# Patient Record
Sex: Male | Born: 1980
Health system: Southern US, Community
[De-identification: ages and names within clinical notes are randomized; demographics above are authoritative.]

## PROBLEM LIST (undated history)

## (undated) DIAGNOSIS — M549 Dorsalgia, unspecified: Secondary | ICD-10-CM

## (undated) DIAGNOSIS — R945 Abnormal results of liver function studies: Secondary | ICD-10-CM

## (undated) DIAGNOSIS — E785 Hyperlipidemia, unspecified: Secondary | ICD-10-CM

## (undated) DIAGNOSIS — E739 Lactose intolerance, unspecified: Secondary | ICD-10-CM

## (undated) DIAGNOSIS — R7989 Other specified abnormal findings of blood chemistry: Secondary | ICD-10-CM

## (undated) DIAGNOSIS — E669 Obesity, unspecified: Secondary | ICD-10-CM

## (undated) HISTORY — DX: Hyperlipidemia, unspecified: E78.5

## (undated) HISTORY — DX: Other specified abnormal findings of blood chemistry: R79.89

## (undated) HISTORY — DX: Lactose intolerance, unspecified: E73.9

## (undated) HISTORY — DX: Abnormal results of liver function studies: R94.5

## (undated) HISTORY — PX: NO PAST SURGERIES: SHX2092

## (undated) HISTORY — DX: Obesity, unspecified: E66.9

---

## 2006-12-24 ENCOUNTER — Ambulatory Visit: Payer: Self-pay | Admitting: Internal Medicine

## 2006-12-24 DIAGNOSIS — D239 Other benign neoplasm of skin, unspecified: Secondary | ICD-10-CM

## 2006-12-25 LAB — CONVERTED CEMR LAB
Basophils Relative: 0.6 % (ref 0.0–1.0)
CO2: 31 meq/L (ref 19–32)
Calcium: 9.4 mg/dL (ref 8.4–10.5)
Chloride: 105 meq/L (ref 96–112)
Cholesterol: 203 mg/dL (ref 0–200)
Creatinine, Ser: 1.2 mg/dL (ref 0.4–1.5)
Direct LDL: 141.6 mg/dL
Eosinophils Relative: 4.5 % (ref 0.0–5.0)
GFR calc non Af Amer: 77 mL/min
Glucose, Bld: 67 mg/dL — ABNORMAL LOW (ref 70–99)
HCT: 47.7 % (ref 39.0–52.0)
Neutrophils Relative %: 47.1 % (ref 43.0–77.0)
Platelets: 157 10*3/uL (ref 150–400)
RBC: 5.37 M/uL (ref 4.22–5.81)
RDW: 13.3 % (ref 11.5–14.6)
Sodium: 140 meq/L (ref 135–145)
TSH: 2.77 microintl units/mL (ref 0.35–5.50)
WBC: 7 10*3/uL (ref 4.5–10.5)

## 2008-01-21 ENCOUNTER — Ambulatory Visit: Payer: Self-pay | Admitting: Internal Medicine

## 2008-01-24 ENCOUNTER — Encounter (INDEPENDENT_AMBULATORY_CARE_PROVIDER_SITE_OTHER): Payer: Self-pay | Admitting: *Deleted

## 2008-01-27 ENCOUNTER — Encounter (INDEPENDENT_AMBULATORY_CARE_PROVIDER_SITE_OTHER): Payer: Self-pay | Admitting: *Deleted

## 2008-09-01 ENCOUNTER — Ambulatory Visit: Payer: Self-pay | Admitting: Internal Medicine

## 2008-09-11 ENCOUNTER — Encounter (INDEPENDENT_AMBULATORY_CARE_PROVIDER_SITE_OTHER): Payer: Self-pay | Admitting: *Deleted

## 2008-10-27 ENCOUNTER — Encounter: Payer: Self-pay | Admitting: Internal Medicine

## 2009-01-28 ENCOUNTER — Ambulatory Visit: Payer: Self-pay | Admitting: Internal Medicine

## 2009-01-28 ENCOUNTER — Encounter (INDEPENDENT_AMBULATORY_CARE_PROVIDER_SITE_OTHER): Payer: Self-pay | Admitting: *Deleted

## 2009-02-10 ENCOUNTER — Ambulatory Visit: Payer: Self-pay | Admitting: Internal Medicine

## 2009-02-16 LAB — CONVERTED CEMR LAB
ALT: 35 units/L (ref 0–53)
AST: 32 units/L (ref 0–37)
Basophils Absolute: 0 10*3/uL (ref 0.0–0.1)
Basophils Relative: 0.8 % (ref 0.0–3.0)
CO2: 30 meq/L (ref 19–32)
Calcium: 9.5 mg/dL (ref 8.4–10.5)
Creatinine, Ser: 1.1 mg/dL (ref 0.4–1.5)
Eosinophils Absolute: 0.2 10*3/uL (ref 0.0–0.7)
GFR calc non Af Amer: 83.62 mL/min (ref >60)
HDL: 42.5 mg/dL (ref >39.00)
Lymphocytes Relative: 38.8 % (ref 12.0–46.0)
MCHC: 33 g/dL (ref 30.0–36.0)
Monocytes Relative: 7.1 % (ref 3.0–12.0)
Neutrophils Relative %: 50.4 % (ref 43.0–77.0)
RBC: 5.21 M/uL (ref 4.22–5.81)
Triglycerides: 132 mg/dL (ref 0.0–149.0)

## 2010-02-17 ENCOUNTER — Ambulatory Visit: Payer: Self-pay | Admitting: Internal Medicine

## 2010-02-23 LAB — CONVERTED CEMR LAB
Basophils Absolute: 0 10*3/uL (ref 0.0–0.1)
Basophils Relative: 0.6 % (ref 0.0–3.0)
Calcium: 9.6 mg/dL (ref 8.4–10.5)
GFR calc non Af Amer: 71.66 mL/min (ref >60.00)
HDL: 42.9 mg/dL (ref >39.00)
Hemoglobin: 15.9 g/dL (ref 13.0–17.0)
Lymphocytes Relative: 42.5 % (ref 12.0–46.0)
Monocytes Relative: 7.7 % (ref 3.0–12.0)
Neutro Abs: 3.7 10*3/uL (ref 1.4–7.7)
RBC: 5.28 M/uL (ref 4.22–5.81)
Sodium: 140 meq/L (ref 135–145)
Total CHOL/HDL Ratio: 4
VLDL: 24.2 mg/dL (ref 0.0–40.0)
WBC: 8.1 10*3/uL (ref 4.5–10.5)

## 2010-03-31 NOTE — Assessment & Plan Note (Signed)
Summary: CPX,FASTING,[employer] INS/RH......   Vital Signs:  Patient profile:   30 year old male Height:      70 inches Weight:      221.25 pounds BMI:     31.86 Pulse rate:   92 / minute Pulse rhythm:   regular BP sitting:   116 / 74  (left arm) Cuff size:   large  Vitals Entered By: Army Fossa CMA (February 17, 2010 8:26 AM)  CC: CPX, fasting  Comments no complaints  [facility] [address]    History of Present Illness: CPX  Current Medications (verified): 1)  None  Allergies (verified): No Known Drug Allergies  Past History:  Past Medical History: Reviewed history from 12/24/2006 and no changes required. NONE  Past Surgical History: Reviewed history from 12/24/2006 and no changes required. NONE  Family History: MOTHER:LIVING FATHER:LIVING 1 BROTHER:LIVING CANCER: PGF colon ca--no prostate ca--no DM-- MGF MI--no  Social History: Single no children from [place] tobacco-- no ETOH-- socially Exercise-- does go to the gym, bike diet-- regular  Review of Systems CV:  Denies chest pain or discomfort and swelling of feet. Resp:  Denies cough and shortness of breath. GI:  Denies bloody stools, nausea, and vomiting. GU:  Denies dysuria and hematuria. Psych:  Denies anxiety and depression.  Physical Exam  General:  alert and well-developed.   Neck:  no thyromegaly   Lungs:  normal respiratory effort, no intercostal retractions, no accessory muscle use, and normal breath sounds.   Heart:  normal rate, regular rhythm, no murmur, and no gallop.   Abdomen:  soft, non-tender, no distention, no masses, no hepatomegaly, and no splenomegaly.   Extremities:  no pretibial edema bilaterally  Psych:  Oriented X3, memory intact for recent and remote, normally interactive, good eye contact, not anxious appearing, and not depressed appearing.     Impression & Recommendations:  Problem # 1:  HEALTH SCREENING (ICD-V70.0) Td 2008 we runned out of flu shots, rec to go to  his pharmacy if interested   diet- exercise--  encouraged continue w/ exercise, improve diet (printed material provided) does STE, encouraged to continue   Orders: Venipuncture (16109) TLB-Lipid Panel (80061-LIPID) TLB-BMP (Basic Metabolic Panel-BMET) (80048-METABOL) TLB-CBC Platelet - w/Differential (85025-CBCD) Specimen Handling (60454) Specimen Handling (09811)  Problem # 2:  MOLE (ICD-216.9) so far has not scheduled surgery  for his large mole @  left leg. I explained the risk of  malignancy Encouraged him to call Dr. Corliss Skains and schedule the excision  Patient Instructions: 1)  Please schedule a follow-up appointment in 1 year.    Orders Added: 1)  Venipuncture [36415] 2)  TLB-Lipid Panel [80061-LIPID] 3)  TLB-BMP (Basic Metabolic Panel-BMET) [80048-METABOL] 4)  TLB-CBC Platelet - w/Differential [85025-CBCD] 5)  Specimen Handling [99000] 6)  Est. Patient age 38-39 [34]     Risk Factors:  Alcohol use:  no

## 2010-05-31 ENCOUNTER — Telehealth: Payer: Self-pay | Admitting: Internal Medicine

## 2010-05-31 NOTE — Telephone Encounter (Signed)
Schedule a OV

## 2010-05-31 NOTE — Telephone Encounter (Signed)
Pt would like to be STD tested. Please provide orders so an appt can be made.

## 2010-05-31 NOTE — Telephone Encounter (Signed)
Pt called back and scheduled appt for April 13th @ 10:30 w/ Dr. Drue Novel.

## 2010-05-31 NOTE — Telephone Encounter (Signed)
Pt refused appt

## 2010-05-31 NOTE — Telephone Encounter (Signed)
lmtcb

## 2010-06-08 ENCOUNTER — Encounter: Payer: Self-pay | Admitting: Internal Medicine

## 2010-06-10 ENCOUNTER — Ambulatory Visit (INDEPENDENT_AMBULATORY_CARE_PROVIDER_SITE_OTHER): Payer: BC Managed Care – PPO | Admitting: Internal Medicine

## 2010-06-10 ENCOUNTER — Encounter: Payer: Self-pay | Admitting: Internal Medicine

## 2010-06-10 VITALS — BP 140/84 | HR 82 | Wt 223.2 lb

## 2010-06-10 DIAGNOSIS — N342 Other urethritis: Secondary | ICD-10-CM | POA: Insufficient documentation

## 2010-06-10 MED ORDER — CEFIXIME 400 MG PO TABS: 400.0000 mg | ORAL_TABLET | Freq: Every day | ORAL | Status: AC

## 2010-06-10 NOTE — Assessment & Plan Note (Addendum)
Recently diagnosed with Chlamydia urethritis. Patient was asymptomatic. My personal preference is to treat for gonorrhea once I diagnosed chlamydia Plan: Repeat HIV test in 6 months Antibiotics,  See prescription All his sexual partners need to be notified. Patient aware. Safe sex discussed as well

## 2010-06-10 NOTE — Progress Notes (Signed)
  Subjective:    Patient ID: Bradley James, male    DOB: September 24, 1980, 30 y.o.   MRN: [id number]  HPI  Micah Flesher to the health department a few days ago, was diagnosed with chlamydia. Gonorrhea test was negative. RPR was negative. HIV pending. He was treated with a single dose of an antibiotic (zithromax?)  No past medical history on file.  No past surgical history on file.  Review of Systems Patient went to the health department just for a checkup, he was asymptomatic. Last time he was sexually active without protection was  09-2009. Currently feeling well. No dysuria, hematuria, penile discharge or genital rash    Objective:   Physical Exam Alert oriented in no apparent distress       Assessment & Plan:  Face-to-face >15 minutes, more than 50% of this time counseling

## 2010-12-09 ENCOUNTER — Ambulatory Visit (INDEPENDENT_AMBULATORY_CARE_PROVIDER_SITE_OTHER): Payer: BC Managed Care – PPO | Admitting: Internal Medicine

## 2010-12-09 ENCOUNTER — Encounter: Payer: Self-pay | Admitting: Internal Medicine

## 2010-12-09 VITALS — BP 104/72 | HR 65 | Temp 98.3°F | Resp 16 | Wt 227.5 lb

## 2010-12-09 DIAGNOSIS — K2940 Chronic atrophic gastritis without bleeding: Secondary | ICD-10-CM

## 2010-12-09 DIAGNOSIS — N342 Other urethritis: Secondary | ICD-10-CM

## 2010-12-09 DIAGNOSIS — K2950 Unspecified chronic gastritis without bleeding: Secondary | ICD-10-CM | POA: Insufficient documentation

## 2010-12-09 NOTE — Patient Instructions (Signed)
Continue medicines as prescribed for 2 weeks

## 2010-12-09 NOTE — Assessment & Plan Note (Addendum)
Documented gastric  H. pylori infection by biopsy 09-2010 I agree with current regimen that includes PPIs, amoxicillin 1 g twice a day for 2 weeks, clarithromycin 500 mg twice a day for 2 weeks.  Encourage compliance.

## 2010-12-09 NOTE — Assessment & Plan Note (Signed)
Recheck HIV and RPR

## 2010-12-09 NOTE — Progress Notes (Signed)
  Subjective:    Patient ID: Bradley James, male    DOB: [birth year], 30 y.o.   MRN: [id number]  HPI Routine office visit History of urethritis, now asx  In August , he was visiting [place], developed a stomach problems mostly postprandial diarrhea, went to a gastroenterologist, had EGD 10/12/2010, it showed chronic gastritis, biopsies showed Helicobacter pylori. He was prescribed antibiotics which he actually started 5 days ago. See assessment and plan  No past medical history on file.  No past surgical history on file.    Review of Systems Since the endoscopy, he is doing better, suspected diarrhea was rather related to food in [place]. Currently he denies abdominal pain blood in the stools or any GI symptoms. Also denies dysuria, penile discharge.     Objective:   Physical Exam  Constitutional: He appears well-developed and well-nourished.  Abdominal: Soft. Bowel sounds are normal. He exhibits no distension. There is no tenderness. There is no rebound and no guarding.       Assessment & Plan:

## 2010-12-14 ENCOUNTER — Telehealth: Payer: Self-pay | Admitting: *Deleted

## 2010-12-14 NOTE — Telephone Encounter (Signed)
Left msg for pt to return call.

## 2010-12-14 NOTE — Telephone Encounter (Signed)
Message copied by Doristine Devoid on Wed Dec 14, 2010  4:28 PM ------      Message from: Bradley James      Created: Tue Dec 13, 2010  6:04 PM       Advise patient, labs are normal

## 2011-06-09 ENCOUNTER — Encounter: Payer: BC Managed Care – PPO | Admitting: Internal Medicine

## 2011-06-26 ENCOUNTER — Ambulatory Visit (INDEPENDENT_AMBULATORY_CARE_PROVIDER_SITE_OTHER): Payer: BC Managed Care – PPO | Admitting: Internal Medicine

## 2011-06-26 ENCOUNTER — Encounter: Payer: Self-pay | Admitting: Internal Medicine

## 2011-06-26 DIAGNOSIS — Z0000 Encounter for general adult medical examination without abnormal findings: Secondary | ICD-10-CM | POA: Insufficient documentation

## 2011-06-26 LAB — CBC WITH DIFFERENTIAL/PLATELET
Basophils Absolute: 0 10*3/uL (ref 0.0–0.1)
Eosinophils Absolute: 0.2 10*3/uL (ref 0.0–0.7)
Lymphocytes Relative: 40.6 % (ref 12.0–46.0)
MCHC: 33.2 g/dL (ref 30.0–36.0)
MCV: 89.1 fl (ref 78.0–100.0)
Monocytes Absolute: 0.4 10*3/uL (ref 0.1–1.0)
Neutrophils Relative %: 48.5 % (ref 43.0–77.0)
Platelets: 131 10*3/uL — ABNORMAL LOW (ref 150.0–400.0)
WBC: 5.9 10*3/uL (ref 4.5–10.5)

## 2011-06-26 LAB — LIPID PANEL
Cholesterol: 184 mg/dL (ref 0–200)
LDL Cholesterol: 98 mg/dL (ref 0–99)
Total CHOL/HDL Ratio: 4
Triglycerides: 190 mg/dL — ABNORMAL HIGH (ref 0.0–149.0)
VLDL: 38 mg/dL (ref 0.0–40.0)

## 2011-06-26 LAB — COMPREHENSIVE METABOLIC PANEL
AST: 35 U/L (ref 0–37)
Albumin: 4.2 g/dL (ref 3.5–5.2)
Alkaline Phosphatase: 49 U/L (ref 39–117)
Glucose, Bld: 84 mg/dL (ref 70–99)
Potassium: 4.1 mEq/L (ref 3.5–5.1)
Sodium: 141 mEq/L (ref 135–145)
Total Bilirubin: 0.5 mg/dL (ref 0.3–1.2)
Total Protein: 7.3 g/dL (ref 6.0–8.3)

## 2011-06-26 NOTE — Patient Instructions (Addendum)
lactaid OTC as needed if you like milk Exercise 30 minutes every day. Wide shoes with arch support

## 2011-06-26 NOTE — Assessment & Plan Note (Addendum)
Td 08 Conseled  about diet, exercise, BMI. labs Self testicular exam encouraged Return to the office in one year Other issues: apparently has lactose intolerance, recommend lactate. Feet pain, recommend wide shoes and arch support

## 2011-06-26 NOTE — Progress Notes (Signed)
  Subjective:    Patient ID: Bradley James, male    DOB: 12-Oct-1980, 31 y.o.   MRN: [id number]  HPI CPX  Past Medical History: no  Past Surgical History: no  Family History: M& F & Brpother:LIVING CANCER: PGF, uncle (?leukemia) colon ca--no prostate ca--no DM-- MGF MI--no  Social History: Married, no children from [place] tobacco-- no ETOH-- socially Exercise-- no lately diet-- regular  Review of Systems Complaining of R>L feet pain, mostly in the arch, he works on his feet a lot. No chest pain or short of breath No nausea or vomiting.occ diarrhea, usually related to dairy products. Has gained weight, admits to a sedentary lifestyle, diabetes regular No dysuria, gross artery or difficulty urinating    Objective:   Physical Exam General:  alert and well-developed.   Neck:  no thyromegaly   Lungs:  normal respiratory effort, no intercostal retractions, no accessory muscle use, and normal breath sounds.   Heart:  normal rate, regular rhythm, no murmur, and no gallop.   Abdomen:  soft, non-tender, no distention, no masses, no hepatomegaly, and no splenomegaly.   Extremities:  no pretibial edema bilaterally  Psych:  Oriented X3, memory intact for recent and remote, normally interactive, good eye contact, not anxious appearing, and not depressed appearing.      Assessment & Plan:

## 2011-06-29 ENCOUNTER — Encounter: Payer: Self-pay | Admitting: *Deleted

## 2012-04-13 ENCOUNTER — Other Ambulatory Visit: Payer: Self-pay

## 2012-06-25 ENCOUNTER — Encounter: Payer: BC Managed Care – PPO | Admitting: Internal Medicine

## 2012-07-30 ENCOUNTER — Ambulatory Visit (INDEPENDENT_AMBULATORY_CARE_PROVIDER_SITE_OTHER): Payer: BC Managed Care – PPO | Admitting: Internal Medicine

## 2012-07-30 ENCOUNTER — Encounter: Payer: Self-pay | Admitting: Internal Medicine

## 2012-07-30 VITALS — BP 112/82 | HR 64 | Temp 98.4°F | Ht 70.0 in | Wt 218.0 lb

## 2012-07-30 DIAGNOSIS — Z0000 Encounter for general adult medical examination without abnormal findings: Secondary | ICD-10-CM

## 2012-07-30 NOTE — Patient Instructions (Addendum)
Come back fasting: FLP, LFTs--- dx V70 ---- Next visit one year

## 2012-07-30 NOTE — Assessment & Plan Note (Addendum)
Td 08 Doing well w/ about diet, exercise, has lost some weight, praised Labs History lactose intolerance, well-controlled w/ avoidance Self testicular exam-- reports exams are normal Return to the office in one year

## 2012-07-30 NOTE — Progress Notes (Signed)
  Subjective:    Patient ID: Bradley James, male    DOB: 1980-06-10, 32 y.o.   MRN: [id number]  HPI CPX  No past medical history on file.  Past Surgical History  Procedure Laterality Date  . No past surgeries     History   Social History  . Marital Status: Single    Spouse Name: N/A    Number of Children: 0  . Years of Education: N/A   Occupational History  . machine operator     Social History Main Topics  . Smoking status: Never Smoker   . Smokeless tobacco: Never Used  . Alcohol Use: Yes     Comment: socially   . Drug Use: No  . Sexually Active: Not on file   Other Topics Concern  . Not on file   Social History Narrative   Original from [place], married                   Family History  Problem Relation Age of Onset  . Cancer Paternal Grandfather     stomach, let in life  . Prostate cancer Neg Hx   . Diabetes Maternal Grandfather   . Heart attack Neg Hx   . Hypertension Other     aunt  . Hyperlipidemia Father     high TG      Review of Systems Active at work, does exercise regularly , active doing yard work No chest pain or shortness or breath No nausea, vomiting, diarrhea or blood in the stools. Within their problems due to lactose intolerance. One time had blurred vision for a short period of time, did not have headaches, nausea, fainting. No dysuria or gross hematuria.     Objective:   Physical Exam BP 112/82  Pulse 64  Temp(Src) 98.4 F (36.9 C) (Oral)  Ht 5\' 10"  (1.778 m)  Wt 218 lb (98.884 kg)  BMI 31.28 kg/m2  SpO2 98%  General -- alert, well-developed, NAD   Neck --no thyromegaly , normal carotid pulse Lungs -- normal respiratory effort, no intercostal retractions, no accessory muscle use, and normal breath sounds.   Heart-- normal rate, regular rhythm, no murmur, and no gallop.   Abdomen--soft, non-tender, no distention, no masses, no HSM, no guarding, and no rigidity.   Extremities-- no pretibial edema bilaterally Neurologic--  alert & oriented X3 and strength normal in all extremities. Psych-- Cognition and judgment appear intact. Alert and cooperative with normal attention span and concentration.  not anxious appearing and not depressed appearing.        Assessment & Plan:

## 2012-07-31 ENCOUNTER — Other Ambulatory Visit: Payer: Self-pay | Admitting: Internal Medicine

## 2012-07-31 DIAGNOSIS — Z0000 Encounter for general adult medical examination without abnormal findings: Secondary | ICD-10-CM

## 2012-08-02 ENCOUNTER — Other Ambulatory Visit: Payer: BC Managed Care – PPO

## 2012-08-08 ENCOUNTER — Other Ambulatory Visit (INDEPENDENT_AMBULATORY_CARE_PROVIDER_SITE_OTHER): Payer: BC Managed Care – PPO

## 2012-08-08 DIAGNOSIS — Z0000 Encounter for general adult medical examination without abnormal findings: Secondary | ICD-10-CM

## 2012-08-08 LAB — LIPID PANEL
Cholesterol: 172 mg/dL (ref 0–200)
HDL: 47 mg/dL (ref >39.00)
Triglycerides: 99 mg/dL (ref 0.0–149.0)

## 2012-08-08 LAB — HEPATIC FUNCTION PANEL
ALT: 33 U/L (ref 0–53)
AST: 23 U/L (ref 0–37)
Albumin: 4 g/dL (ref 3.5–5.2)
Alkaline Phosphatase: 48 U/L (ref 39–117)
Total Protein: 6.9 g/dL (ref 6.0–8.3)

## 2012-12-03 ENCOUNTER — Encounter: Payer: Self-pay | Admitting: Family

## 2012-12-03 ENCOUNTER — Ambulatory Visit (INDEPENDENT_AMBULATORY_CARE_PROVIDER_SITE_OTHER): Payer: BC Managed Care – PPO | Admitting: Family

## 2012-12-03 ENCOUNTER — Telehealth: Payer: Self-pay | Admitting: *Deleted

## 2012-12-03 VITALS — BP 124/84 | HR 66 | Temp 98.4°F | Resp 16 | Ht 70.0 in | Wt 228.0 lb

## 2012-12-03 DIAGNOSIS — S335XXA Sprain of ligaments of lumbar spine, initial encounter: Secondary | ICD-10-CM

## 2012-12-03 DIAGNOSIS — S39012A Strain of muscle, fascia and tendon of lower back, initial encounter: Secondary | ICD-10-CM | POA: Insufficient documentation

## 2012-12-03 MED ORDER — CYCLOBENZAPRINE HCL 5 MG PO TABS: 5.0000 mg | ORAL_TABLET | Freq: Every evening | ORAL | Status: DC | PRN

## 2012-12-03 MED ORDER — METHYLPREDNISOLONE 4 MG PO KIT: PACK | ORAL | Status: DC

## 2012-12-03 NOTE — Progress Notes (Signed)
  Subjective:    Patient ID: Bradley James, male    DOB: [birth year], 32 y.o.   MRN: [id number]  HPI  Bradley James is a 32 yr old male who presents today with chief complaint of low back pain.  Reports that pain started 2 days ago after walking down some steps.  Has been present since that time. Denies associated radiation or LE weakness.  Reports similar symptoms x 1 day a year ago.  Pt reports that his current pain is 7-8/10.     Review of Systems See HPI  No past medical history on file.  History   Social History  . Marital Status: Single    Spouse Name: N/A    Number of Children: 0  . Years of Education: N/A   Occupational History  . machine operator     Social History Main Topics  . Smoking status: Never Smoker   . Smokeless tobacco: Never Used  . Alcohol Use: Yes     Comment: socially   . Drug Use: No  . Sexual Activity: Not on file   Other Topics Concern  . Not on file   Social History Narrative   Original from [place], married                    Past Surgical History  Procedure Laterality Date  . No past surgeries      Family History  Problem Relation Age of Onset  . Cancer Paternal Grandfather     stomach, let in life  . Prostate cancer Neg Hx   . Diabetes Maternal Grandfather   . Heart attack Neg Hx   . Hypertension Other     aunt  . Hyperlipidemia Father     high TG    No Known Allergies  No current outpatient prescriptions on file prior to visit.   No current facility-administered medications on file prior to visit.    BP 124/84  Pulse 66  Temp(Src) 98.4 F (36.9 C) (Oral)  Resp 16  Ht 5\' 10"  (1.778 m)  Wt 228 lb (103.42 kg)  BMI 32.71 kg/m2  SpO2 99%        Objective:   Physical Exam  Constitutional: He is oriented to person, place, and time. He appears well-developed and well-nourished. No distress.  Cardiovascular: Normal rate and regular rhythm.   No murmur heard. Pulmonary/Chest: Effort normal and breath sounds normal.  No respiratory distress. He has no wheezes. He has no rales. He exhibits no tenderness.  Musculoskeletal: He exhibits no edema.       Cervical back: He exhibits no tenderness.       Thoracic back: He exhibits no tenderness.       Lumbar back: He exhibits no tenderness.  Neurological: He is alert and oriented to person, place, and time.  Psychiatric: He has a normal mood and affect. His behavior is normal. Judgment and thought content normal.  mild increase in Low back pain with straight leg raise bilaterally.        Assessment & Plan:

## 2012-12-03 NOTE — Patient Instructions (Addendum)
Call if symptoms worsen or if not improved in 1 week.  Back Pain, Adult Low back pain is very common. About 1 in 5 people have back pain.The cause of low back pain is rarely dangerous. The pain often gets better over time.About half of people with a sudden onset of back pain feel better in just 2 weeks. About 8 in 10 people feel better by 6 weeks.  CAUSES Some common causes of back pain include:  Strain of the muscles or ligaments supporting the spine.  Wear and tear (degeneration) of the spinal discs.  Arthritis.  Direct injury to the back. DIAGNOSIS Most of the time, the direct cause of low back pain is not known.However, back pain can be treated effectively even when the exact cause of the pain is unknown.Answering your caregiver's questions about your overall health and symptoms is one of the most accurate ways to make sure the cause of your pain is not dangerous. If your caregiver needs more information, he or she may order lab work or imaging tests (X-rays or MRIs).However, even if imaging tests show changes in your back, this usually does not require surgery. HOME CARE INSTRUCTIONS For many people, back pain returns.Since low back pain is rarely dangerous, it is often a condition that people can learn to [facility] their own.   Remain active. It is stressful on the back to sit or stand in one place. Do not sit, drive, or stand in one place for more than 30 minutes at a time. Take short walks on level surfaces as soon as pain allows.Try to increase the length of time you walk each day.  Do not stay in bed.Resting more than 1 or 2 days can delay your recovery.  Do not avoid exercise or work.Your body is made to move.It is not dangerous to be active, even though your back may hurt.Your back will likely heal faster if you return to being active before your pain is gone.  Pay attention to your body when you bend and lift. Many people have less discomfortwhen lifting if they  bend their knees, keep the load close to their bodies,and avoid twisting. Often, the most comfortable positions are those that put less stress on your recovering back.  Find a comfortable position to sleep. Use a firm mattress and lie on your side with your knees slightly bent. If you lie on your back, put a pillow under your knees.  Only take over-the-counter or prescription medicines as directed by your caregiver. Over-the-counter medicines to reduce pain and inflammation are often the most helpful.Your caregiver may prescribe muscle relaxant drugs.These medicines help dull your pain so you can more quickly return to your normal activities and healthy exercise.  Put ice on the injured area.  Put ice in a plastic bag.  Place a towel between your skin and the bag.  Leave the ice on for 15-20 minutes, 3-4 times a day for the first 2 to 3 days. After that, ice and heat may be alternated to reduce pain and spasms.  Ask your caregiver about trying back exercises and gentle massage. This may be of some benefit.  Avoid feeling anxious or stressed.Stress increases muscle tension and can worsen back pain.It is important to recognize when you are anxious or stressed and learn ways to manage it.Exercise is a great option. SEEK MEDICAL CARE IF:  You have pain that is not relieved with rest or medicine.  You have pain that does not improve in 1 week.  You have new symptoms.  You are generally not feeling well. SEEK IMMEDIATE MEDICAL CARE IF:   You have pain that radiates from your back into your legs.  You develop new bowel or bladder control problems.  You have unusual weakness or numbness in your arms or legs.  You develop nausea or vomiting.  You develop abdominal pain.  You feel faint. Document Released: 02/13/2005 Document Revised: 08/15/2011 Document Reviewed: 07/04/2010 [facility] Patient Information 2014 [place], [place].

## 2012-12-03 NOTE — Telephone Encounter (Signed)
Received call from pt that pharmacy never received cyclobenzaprine rx. It appears that rx printed. Called rx to pharmacist per previous order.

## 2012-12-03 NOTE — Assessment & Plan Note (Signed)
New.  Will rx with medrol dose pak and flexeril. He is instructed not to drive or operate machinery after taking flexeril.  Follow up if symptoms worsen or if symptoms do not improve.

## 2013-01-02 ENCOUNTER — Other Ambulatory Visit: Payer: Self-pay

## 2013-07-31 ENCOUNTER — Telehealth: Payer: Self-pay

## 2013-07-31 NOTE — Telephone Encounter (Signed)
Left message for call back Non identifiable  Flu vaccine--12/2012 Tdap--11/2006

## 2013-08-01 ENCOUNTER — Encounter: Payer: Self-pay | Admitting: Internal Medicine

## 2013-08-01 ENCOUNTER — Ambulatory Visit (INDEPENDENT_AMBULATORY_CARE_PROVIDER_SITE_OTHER): Payer: BC Managed Care – PPO | Admitting: Internal Medicine

## 2013-08-01 VITALS — BP 112/71 | HR 62 | Temp 97.8°F | Ht 71.0 in | Wt 229.0 lb

## 2013-08-01 DIAGNOSIS — D239 Other benign neoplasm of skin, unspecified: Secondary | ICD-10-CM

## 2013-08-01 DIAGNOSIS — Z0000 Encounter for general adult medical examination without abnormal findings: Secondary | ICD-10-CM

## 2013-08-01 LAB — CBC WITH DIFFERENTIAL/PLATELET
BASOS ABS: 0 10*3/uL (ref 0.0–0.1)
Basophils Relative: 0.8 % (ref 0.0–3.0)
EOS ABS: 0.2 10*3/uL (ref 0.0–0.7)
Eosinophils Relative: 3.7 % (ref 0.0–5.0)
HCT: 47.7 % (ref 39.0–52.0)
HEMOGLOBIN: 15.7 g/dL (ref 13.0–17.0)
LYMPHS PCT: 41.6 % (ref 12.0–46.0)
Lymphs Abs: 2.6 10*3/uL (ref 0.7–4.0)
MCHC: 32.9 g/dL (ref 30.0–36.0)
MCV: 88.8 fl (ref 78.0–100.0)
Monocytes Absolute: 0.4 10*3/uL (ref 0.1–1.0)
Monocytes Relative: 6.6 % (ref 3.0–12.0)
NEUTROS ABS: 3 10*3/uL (ref 1.4–7.7)
Neutrophils Relative %: 47.3 % (ref 43.0–77.0)
PLATELETS: 156 10*3/uL (ref 150.0–400.0)
RBC: 5.37 Mil/uL (ref 4.22–5.81)
RDW: 14.4 % (ref 11.5–15.5)
WBC: 6.4 10*3/uL (ref 4.0–10.5)

## 2013-08-01 LAB — LIPID PANEL
CHOL/HDL RATIO: 5
Cholesterol: 208 mg/dL — ABNORMAL HIGH (ref 0–200)
HDL: 45.1 mg/dL (ref >39.00)
LDL Cholesterol: 142 mg/dL — ABNORMAL HIGH (ref 0–99)
NonHDL: 162.9
TRIGLYCERIDES: 103 mg/dL (ref 0.0–149.0)
VLDL: 20.6 mg/dL (ref 0.0–40.0)

## 2013-08-01 LAB — COMPREHENSIVE METABOLIC PANEL
ALT: 83 U/L — AB (ref 0–53)
AST: 46 U/L — ABNORMAL HIGH (ref 0–37)
Albumin: 4.4 g/dL (ref 3.5–5.2)
Alkaline Phosphatase: 43 U/L (ref 39–117)
BUN: 18 mg/dL (ref 6–23)
CALCIUM: 9.3 mg/dL (ref 8.4–10.5)
CHLORIDE: 103 meq/L (ref 96–112)
CO2: 27 meq/L (ref 19–32)
CREATININE: 1 mg/dL (ref 0.4–1.5)
GFR: 86.72 mL/min (ref >60.00)
Glucose, Bld: 85 mg/dL (ref 70–99)
POTASSIUM: 4.1 meq/L (ref 3.5–5.1)
Sodium: 137 mEq/L (ref 135–145)
Total Bilirubin: 0.8 mg/dL (ref 0.2–1.2)
Total Protein: 7.4 g/dL (ref 6.0–8.3)

## 2013-08-01 LAB — VITAMIN B12: Vitamin B-12: 490 pg/mL (ref 211–911)

## 2013-08-01 LAB — FOLATE: Folate: 20.3 ng/mL (ref >5.9)

## 2013-08-01 LAB — TSH: TSH: 2.56 u[IU]/mL (ref 0.35–4.50)

## 2013-08-01 MED ORDER — KETOCONAZOLE 2 % EX CREA: 1.0000 "application " | TOPICAL_CREAM | Freq: Every day | CUTANEOUS | Status: DC

## 2013-08-01 NOTE — Progress Notes (Signed)
Pre visit review using our clinic review tool, if applicable. No additional management support is needed unless otherwise documented below in the visit note. 

## 2013-08-01 NOTE — Patient Instructions (Signed)
Get your blood work before you leave   Next visit is for a physical exam in 1 year, fasting Please make an appointment

## 2013-08-01 NOTE — Assessment & Plan Note (Addendum)
Td 08 Was doing better with diet and exercise, has gained weight. Patient is counseled.  Very seldom has diarrhea, usually related to lactose intake. He continue practicing self testicular examination,  normal findings. Having problems with infertility, low sperm count, currently managed by gynecology, likely to be referred to urology in the near future. Will check a testosterone level today. Labs Right foot dermatitis, likely fungal, will prescribe ketoconazole. At some point will consider po therapy if needed Return to the office in one year

## 2013-08-01 NOTE — Progress Notes (Signed)
   Subjective:    Patient ID: Bradley James, male    DOB: February 26, 1981, 33 y.o.   MRN: [id number]  DOS:  08/01/2013 Type of  Visit: CPX History: In addition to his physical exam, he has several concerns, see physical exam and assessment and plan   ROS Diet, Exercise-- not very active, gained wt No  CP, SOB No palpitations, no lower extremity edema Denies  nausea, vomiting , blood in the stools No GERD, no abd pain occ diarrhea related to lactose (-) cough, sputum production (-) wheezing, chest congestion No dysuria, gross hematuria, difficulty urinating  No anxiety, occ depression     History reviewed. No pertinent past medical history.  Past Surgical History  Procedure Laterality Date  . No past surgeries      History   Social History  . Marital Status: Married    Spouse Name: N/A    Number of Children: 0  . Years of Education: N/A   Occupational History  . machine operator     Social History Main Topics  . Smoking status: Never Smoker   . Smokeless tobacco: Never Used  . Alcohol Use: Yes     Comment: socially   . Drug Use: No  . Sexual Activity: Not on file   Other Topics Concern  . Not on file   Social History Narrative   Original from [place], married                     Family History  Problem Relation Age of Onset  . Cancer Paternal Grandfather     stomach, let in life  . Prostate cancer Neg Hx   . Diabetes Maternal Grandfather   . Heart attack Neg Hx   . Hypertension Other     aunt  . Hyperlipidemia Father     high TG  . Colon cancer Neg Hx        Medication List       This list is accurate as of: 08/01/13 11:59 PM.  Always use your most recent med list.               ketoconazole 2 % cream  Commonly known as:  NIZORAL  Apply 1 application topically daily.           Objective:   Physical Exam  Skin:      BP 112/71  Pulse 62  Temp(Src) 97.8 F (36.6 C)  Ht 5\' 11"  (1.803 m)  Wt 229 lb (103.874 kg)  BMI 31.95 kg/m2   SpO2 99% General -- alert, well-developed, NAD.  Neck --no thyromegaly   HEENT-- Not pale.  Lungs -- normal respiratory effort, no intercostal retractions, no accessory muscle use, and normal breath sounds.  Heart-- normal rate, regular rhythm, no murmur.  Abdomen-- Not distended, good bowel sounds,soft, non-tender.. Extremities-- no pretibial edema bilaterally  Neurologic--  alert & oriented X3. Speech normal, gait appropriate for age, strength symmetric and appropriate for age.   foot--Dry scaly skin at the right plantar area, the other foot and hands are not affected Psych-- Cognition and judgment appear intact. Cooperative with normal attention span and concentration. No anxious or depressed appearing.        Assessment & Plan:

## 2013-08-01 NOTE — Assessment & Plan Note (Addendum)
Was born with a mole at the left leg, had prior biopsies that were negative. Has noted a very mild extension of the mole  proximally  Plan: Refer for consideration of excision

## 2013-08-04 LAB — TESTOSTERONE, FREE, TOTAL, SHBG
Sex Hormone Binding: 20 nmol/L (ref 13–71)
TESTOSTERONE-% FREE: 2.6 % (ref 1.6–2.9)
TESTOSTERONE: 345 ng/dL (ref 300–890)
Testosterone, Free: 89 pg/mL (ref 47.0–244.0)

## 2013-08-04 NOTE — Telephone Encounter (Signed)
Unable to reach prior to visit  

## 2013-08-11 ENCOUNTER — Telehealth: Payer: Self-pay | Admitting: Internal Medicine

## 2013-08-11 DIAGNOSIS — R945 Abnormal results of liver function studies: Secondary | ICD-10-CM

## 2013-08-11 NOTE — Addendum Note (Signed)
Addended by: Peggyann Shoals on: 08/11/2013 01:45 PM   Modules accepted: Orders

## 2013-08-11 NOTE — Telephone Encounter (Signed)
Repeat liver test. Orders put in.

## 2013-08-11 NOTE — Telephone Encounter (Signed)
Patient states that he is suppose to come back in 6 weeks and repeat labs. Please advise.

## 2013-09-11 ENCOUNTER — Other Ambulatory Visit: Payer: BC Managed Care – PPO

## 2013-09-12 ENCOUNTER — Other Ambulatory Visit: Payer: BC Managed Care – PPO

## 2013-09-12 ENCOUNTER — Other Ambulatory Visit (INDEPENDENT_AMBULATORY_CARE_PROVIDER_SITE_OTHER): Payer: BC Managed Care – PPO

## 2013-09-12 DIAGNOSIS — K769 Liver disease, unspecified: Secondary | ICD-10-CM

## 2013-09-12 DIAGNOSIS — R945 Abnormal results of liver function studies: Secondary | ICD-10-CM

## 2013-09-12 LAB — LIPID PANEL
CHOL/HDL RATIO: 4
Cholesterol: 182 mg/dL (ref 0–200)
HDL: 44.2 mg/dL (ref >39.00)
LDL Cholesterol: 115 mg/dL — ABNORMAL HIGH (ref 0–99)
NONHDL: 137.8
Triglycerides: 115 mg/dL (ref 0.0–149.0)
VLDL: 23 mg/dL (ref 0.0–40.0)

## 2013-09-16 ENCOUNTER — Other Ambulatory Visit (INDEPENDENT_AMBULATORY_CARE_PROVIDER_SITE_OTHER): Payer: BC Managed Care – PPO

## 2013-09-16 DIAGNOSIS — K769 Liver disease, unspecified: Secondary | ICD-10-CM

## 2013-09-16 DIAGNOSIS — R945 Abnormal results of liver function studies: Secondary | ICD-10-CM

## 2013-09-16 LAB — AST: AST: 39 U/L — AB (ref 0–37)

## 2013-09-16 LAB — ALT: ALT: 64 U/L — ABNORMAL HIGH (ref 0–53)

## 2013-09-16 NOTE — Addendum Note (Signed)
Addended by: Peggyann Shoals on: 09/16/2013 11:39 AM   Modules accepted: Orders

## 2013-12-16 ENCOUNTER — Encounter: Payer: Self-pay | Admitting: Internal Medicine

## 2013-12-22 ENCOUNTER — Telehealth: Payer: Self-pay | Admitting: Internal Medicine

## 2013-12-22 NOTE — Telephone Encounter (Signed)
Per Dr. Larose Kells he is okay to receive TDAP injection.

## 2013-12-22 NOTE — Telephone Encounter (Signed)
Caller name: Nikolos Relation to pt: Call back [place]   Reason for call:  Pt's wife is pregnant, and Pt wants the vaccine for the whooping cough.  Is pt ok for this; if so I will schedule.

## 2013-12-22 NOTE — Telephone Encounter (Signed)
Pt scheduled  

## 2013-12-24 ENCOUNTER — Ambulatory Visit (INDEPENDENT_AMBULATORY_CARE_PROVIDER_SITE_OTHER): Payer: BC Managed Care – PPO

## 2013-12-24 DIAGNOSIS — Z23 Encounter for immunization: Secondary | ICD-10-CM

## 2013-12-24 NOTE — Progress Notes (Signed)
Pt tolerated injection well.  No signs of reaction upon leaving the clinic.

## 2013-12-24 NOTE — Progress Notes (Signed)
Pre visit review using our clinic review tool, if applicable. No additional management support is needed unless otherwise documented below in the visit note. 

## 2014-02-16 ENCOUNTER — Encounter: Payer: Self-pay | Admitting: Internal Medicine

## 2014-02-17 ENCOUNTER — Telehealth: Payer: Self-pay

## 2014-02-17 NOTE — Telephone Encounter (Signed)
Received MyChart message from Pt, informed by Dr. Larose Kells to have Pt schedule an acute visit. Pt has appt scheduled for 12/28 at 0845 for fungal infection.

## 2014-02-17 NOTE — Telephone Encounter (Signed)
Opened in error

## 2014-02-23 ENCOUNTER — Encounter: Payer: Self-pay | Admitting: Internal Medicine

## 2014-02-23 ENCOUNTER — Ambulatory Visit (INDEPENDENT_AMBULATORY_CARE_PROVIDER_SITE_OTHER): Payer: BC Managed Care – PPO | Admitting: Internal Medicine

## 2014-02-23 VITALS — BP 115/73 | HR 100 | Temp 97.9°F | Ht 70.0 in | Wt 238.4 lb

## 2014-02-23 DIAGNOSIS — L609 Nail disorder, unspecified: Secondary | ICD-10-CM

## 2014-02-23 DIAGNOSIS — D239 Other benign neoplasm of skin, unspecified: Secondary | ICD-10-CM

## 2014-02-23 NOTE — Patient Instructions (Signed)
Please go to dermatology within 3-4 week

## 2014-02-23 NOTE — Progress Notes (Signed)
Pre visit review using our clinic review tool, if applicable. No additional management support is needed unless otherwise documented below in the visit note. 

## 2014-02-23 NOTE — Progress Notes (Signed)
   Subjective:    Patient ID: Bradley James, male    DOB: 06/24/1980, 33 y.o.   MRN: [id number]  DOS:  02/23/2014 Type of visit - description : acute Interval history: ~ 1 month ago noted a lesion at the right great toe nail, since then has noted some extension. Denies pain, swelling, injury. Also was treated with ketoconazole for foot dermatitis, it has improved but not 100%     No past medical history on file.  Past Surgical History  Procedure Laterality Date  . No past surgeries      History   Social History  . Marital Status: Married    Spouse Name: N/A    Number of Children: 0  . Years of Education: N/A   Occupational History  . machine operator     Social History Main Topics  . Smoking status: Never Smoker   . Smokeless tobacco: Never Used  . Alcohol Use: Yes     Comment: socially   . Drug Use: No  . Sexual Activity: Not on file   Other Topics Concern  . Not on file   Social History Narrative   Original from [place], married                        Medication List       This list is accurate as of: 02/23/14  6:37 PM.  Always use your most recent med list.               ketoconazole 2 % cream  Commonly known as:  NIZORAL  Apply 1 application topically daily.           Objective:   Physical Exam  Constitutional: He appears well-developed and well-nourished.  Musculoskeletal:       Feet:  Psychiatric: He has a normal mood and affect. His behavior is normal. Judgment and thought content normal.   BP 115/73 mmHg  Pulse 100  Temp(Src) 97.9 F (36.6 C) (Oral)  Ht 5\' 10"  (1.778 m)  Wt 238 lb 6 oz (108.126 kg)  BMI 34.20 kg/m2  SpO2 97%       Assessment & Plan:

## 2014-02-23 NOTE — Assessment & Plan Note (Addendum)
Was seen by dermatology before, they were contemplating a biopsy

## 2014-02-23 NOTE — Assessment & Plan Note (Addendum)
Very dark lesion at the great toenail, definitely needs to see dermatology, biopsy?Marland Kitchen Referral enter. Patient to call me if he is not seen within 3 weeks. He also has some residual dermatitis at the right plantar area, that can be treated later.

## 2014-02-24 ENCOUNTER — Encounter: Payer: Self-pay | Admitting: Internal Medicine

## 2014-07-15 ENCOUNTER — Telehealth: Payer: Self-pay | Admitting: Internal Medicine

## 2014-07-15 NOTE — Telephone Encounter (Signed)
Pre Visit letter sent  °

## 2014-08-07 ENCOUNTER — Encounter: Payer: BC Managed Care – PPO | Admitting: Internal Medicine

## 2014-08-11 ENCOUNTER — Telehealth: Payer: Self-pay | Admitting: Internal Medicine

## 2014-08-11 NOTE — Telephone Encounter (Signed)
Pre Visit letter sent  °

## 2014-09-01 ENCOUNTER — Telehealth: Payer: Self-pay | Admitting: *Deleted

## 2014-09-01 NOTE — Telephone Encounter (Signed)
Unable to reach patient at time of Pre-Visit Call.  Left message for patient to return call when available.    

## 2014-09-02 ENCOUNTER — Ambulatory Visit (INDEPENDENT_AMBULATORY_CARE_PROVIDER_SITE_OTHER): Payer: BLUE CROSS/BLUE SHIELD | Admitting: Internal Medicine

## 2014-09-02 ENCOUNTER — Encounter: Payer: Self-pay | Admitting: Internal Medicine

## 2014-09-02 VITALS — BP 128/84 | HR 78 | Temp 98.0°F | Ht 70.0 in | Wt 232.4 lb

## 2014-09-02 DIAGNOSIS — Z0000 Encounter for general adult medical examination without abnormal findings: Secondary | ICD-10-CM | POA: Diagnosis not present

## 2014-09-02 DIAGNOSIS — R7989 Other specified abnormal findings of blood chemistry: Secondary | ICD-10-CM | POA: Insufficient documentation

## 2014-09-02 DIAGNOSIS — L609 Nail disorder, unspecified: Secondary | ICD-10-CM

## 2014-09-02 DIAGNOSIS — R945 Abnormal results of liver function studies: Secondary | ICD-10-CM

## 2014-09-02 DIAGNOSIS — D239 Other benign neoplasm of skin, unspecified: Secondary | ICD-10-CM

## 2014-09-02 LAB — COMPREHENSIVE METABOLIC PANEL
ALT: 99 U/L — AB (ref 0–53)
AST: 52 U/L — ABNORMAL HIGH (ref 0–37)
Albumin: 4.2 g/dL (ref 3.5–5.2)
Alkaline Phosphatase: 48 U/L (ref 39–117)
BUN: 15 mg/dL (ref 6–23)
CALCIUM: 9.3 mg/dL (ref 8.4–10.5)
CO2: 26 meq/L (ref 19–32)
Chloride: 107 mEq/L (ref 96–112)
Creatinine, Ser: 1.04 mg/dL (ref 0.40–1.50)
GFR: 86.18 mL/min (ref >60.00)
Glucose, Bld: 84 mg/dL (ref 70–99)
POTASSIUM: 4.2 meq/L (ref 3.5–5.1)
SODIUM: 142 meq/L (ref 135–145)
TOTAL PROTEIN: 7.2 g/dL (ref 6.0–8.3)
Total Bilirubin: 0.4 mg/dL (ref 0.2–1.2)

## 2014-09-02 LAB — LIPID PANEL
Cholesterol: 186 mg/dL (ref 0–200)
HDL: 45.4 mg/dL (ref >39.00)
LDL Cholesterol: 115 mg/dL — ABNORMAL HIGH (ref 0–99)
NONHDL: 140.6
Total CHOL/HDL Ratio: 4
Triglycerides: 126 mg/dL (ref 0.0–149.0)
VLDL: 25.2 mg/dL (ref 0.0–40.0)

## 2014-09-02 LAB — HEPATITIS B CORE ANTIBODY, TOTAL: Hep B Core Total Ab: NONREACTIVE

## 2014-09-02 LAB — HEPATITIS C ANTIBODY: HCV Ab: NEGATIVE

## 2014-09-02 LAB — HEPATITIS B SURFACE ANTIGEN: Hepatitis B Surface Ag: NEGATIVE

## 2014-09-02 MED ORDER — KETOCONAZOLE 2 % EX CREA: 1.0000 "application " | TOPICAL_CREAM | Freq: Every day | CUTANEOUS | Status: DC

## 2014-09-02 NOTE — Assessment & Plan Note (Signed)
On and off elevated LFTs, denies any use of Tylenol, for the last year has been drinking very seldom. Fatty liver? Plan: Check LFTs, check hepatitis serology. Consider ultrasound of the abdomen

## 2014-09-02 NOTE — Assessment & Plan Note (Signed)
Resolved

## 2014-09-02 NOTE — Patient Instructions (Signed)
Get your blood work before you leave   Testicular Self-Exam A self-examination of your testicles involves looking at and feeling your testicles for abnormal lumps or swelling. Several things can cause swelling, lumps, or pain in your testicles. Some of these causes are:  Injuries.  Inflammation.  Infection.  Accumulation of fluids around your testicle (hydrocele).  Twisted testicles (testicular torsion).  Testicular cancer. Self-examination of the testicles and groin areas may be advised if you are at risk for testicular cancer. Risks for testicular cancer include:  An undescended testicle (cryptorchidism).  A history of previous testicular cancer.  A family history of testicular cancer. The testicles are easiest to examine after warm baths or showers and are more difficult to examine when you are cold. This is because the muscles attached to the testicles retract and pull them up higher or into the abdomen. Follow these steps while you are standing:  Hold your penis away from your body.  Roll one testicle between your thumb and forefinger, feeling the entire testicle.  Roll the other testicle between your thumb and forefinger, feeling the entire testicle. Feel for lumps, swelling, or discomfort. A normal testicle is egg shaped and feels firm. It is smooth and not tender. The spermatic cord can be felt as a firm spaghetti-like cord at the back of your testicle. It is also important to examine the crease between the front of your leg and your abdomen. Feel for any bumps that are tender. These could be enlarged lymph nodes.  Document Released: 05/22/2000 Document Revised: 10/16/2012 Document Reviewed: 08/05/2012 [facility] Patient Information 2015 [place], [place]. This information is not intended to replace advice given to you by your health care provider. Make sure you discuss any questions you have with your health care provider.

## 2014-09-02 NOTE — Assessment & Plan Note (Addendum)
Td 08 Has gained weight. Patient is counseled.  rec self testicular examination . Labs  Right foot fungal dermatitis-- improved, request prn ketoconazole

## 2014-09-02 NOTE — Progress Notes (Signed)
Subjective:    Patient ID: Bradley James, male    DOB: 10/11/1980, 34 y.o.   MRN: [id number]  DOS:  09/02/2014 Type of visit - description : CPX Interval history: In general feeling well, has not been able to exercise much lately, they have a few months old baby at home   Review of Systems  Constitutional: No fever. No chills. No unexplained wt changes. No unusual sweats  HEENT: No dental problems, no ear discharge, no facial swelling, no voice changes. No eye discharge, no eye  redness , no  intolerance to light   Respiratory: No wheezing , no  difficulty breathing. No cough , no mucus production  Cardiovascular: No CP, no leg swelling , no  Palpitations  GI: no nausea, no vomiting, no diarrhea , no  abdominal pain.  No blood in the stools. No dysphagia, no odynophagia    Endocrine: No polyphagia, no polyuria , no polydipsia  GU: No dysuria, gross hematuria, difficulty urinating. No urinary urgency, no frequency.  Musculoskeletal: No joint swellings or unusual aches or pains  Skin: No change in the color of the skin, palor , no  Rash  Allergic, immunologic: No environmental allergies , no  food allergies  Neurological: No dizziness no  syncope. No headaches. No diplopia, no slurred, no slurred speech, no motor deficits, no facial  Numbness  Hematological: No enlarged lymph nodes, no easy bruising , no unusual bleedings  Psychiatry: No suicidal ideas, no hallucinations, no beavior problems, no confusion.  No unusual/severe anxiety, no depression   History reviewed. No pertinent past medical history.  Past Surgical History  Procedure Laterality Date  . No past surgeries      History   Social History  . Marital Status: Married    Spouse Name: N/A  . Number of Children: 1  . Years of Education: N/A   Occupational History  . machine operator     Social History Main Topics  . Smoking status: Never Smoker   . Smokeless tobacco: Never Used  . Alcohol Use: Yes   Comment: socially   . Drug Use: No  . Sexual Activity: Not on file   Other Topics Concern  . Not on file   Social History Narrative   Original from [place], married                     Family History  Problem Relation Age of Onset  . Cancer Paternal Grandfather     stomach, late in life  . Prostate cancer Neg Hx   . Diabetes Maternal Grandfather   . Heart attack Neg Hx   . Hypertension Other     aunt  . Hyperlipidemia Father     high TG  . Colon cancer Neg Hx        Medication List       This list is accurate as of: 09/02/14  5:30 PM.  Always use your most recent med list.               ketoconazole 2 % cream  Commonly known as:  NIZORAL  Apply 1 application topically daily.           Objective:   Physical Exam BP 128/84 mmHg  Pulse 78  Temp(Src) 98 F (36.7 C) (Oral)  Ht 5\' 10"  (1.778 m)  Wt 232 lb 6 oz (105.405 kg)  BMI 33.34 kg/m2  SpO2 96% General:   Well developed, well nourished . NAD.  Neck:  Full range of motion. Supple. No  Thyromegaly  HEENT:  Normocephalic . Face symmetric, atraumatic Lungs:  CTA B Normal respiratory effort, no intercostal retractions, no accessory muscle use. Heart: RRR,  no murmur.  No pretibial edema bilaterally  Abdomen:  Not distended, soft, non-tender. No rebound or rigidity. No mass,organomegaly Skin: Exposed areas without rash. Not pale. Not jaundice. Feet skin  healthy Neurologic:  alert & oriented X3.  Speech normal, gait appropriate for age and unassisted Strength symmetric and appropriate for age.  Psych: Cognition and judgment appear intact.  Cooperative with normal attention span and concentration.  Behavior appropriate. No anxious or depressed appearing.        Assessment & Plan:

## 2014-09-02 NOTE — Progress Notes (Signed)
Pre visit review using our clinic review tool, if applicable. No additional management support is needed unless otherwise documented below in the visit note. 

## 2014-09-02 NOTE — Assessment & Plan Note (Signed)
Had a bx, neg. Was rec to be checked by derm q 2 years

## 2014-09-04 NOTE — Addendum Note (Signed)
Addended by: Wilfrid Lund on: 09/04/2014 11:53 AM   Modules accepted: Orders

## 2015-08-20 ENCOUNTER — Telehealth: Payer: Self-pay | Admitting: Internal Medicine

## 2015-08-20 NOTE — Telephone Encounter (Signed)
[facility] - Client TELEPHONE ADVICE RECORD [facility] Patient Name: Bradley James DOB: January 27, 1981 Initial Comment Her husband is having pain in his leg. Nurse Assessment Nurse: Martyn Ehrich RN, Felicia Date/Time (Eastern Time): 08/20/2015 4:17:43 PM Confirm and document reason for call. If symptomatic, describe symptoms. You must click the next button to save text entered. ---PT is having pain in his leg. PT is not with the pt. PT said he had cramp during the night in L leg without swelling. 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 Pain onset yesterday. Told her can only give specific advice if he were present - can give some general advice - but it wont be as complete as if pt was present. Pain is severe - he has not tried pain medication. Denies any long periods of immobility - works on his feet alot Has the patient traveled out of the country within the last 30 days? ---NoDoes the patient have any new or worsening symptoms? ---Yes Will a triage be completed? ---Yes Related visit to physician within the last 2 weeks? ---No Does the PT have any chronic conditions? (i.e. diabetes, asthma, etc.) ---No Is this a behavioral health or substance abuse call? ---No Guidelines Guideline Title Affirmed Question Affirmed Notes Final Disposition User Attempt made - message left Gaddy, RN, Solmon Ice Comments reached caller's father-in-law who said she is at work. [name] is also at work. Told him if he here's from caller tell her if she hasnt heard from MD office for her to call back and he agreed told caller it is hard to say with limited info. But it could possibly be related to a blood clot which would need assessement today in ER - but can't tell over the phone of course. She decided she will try to reach pt and have the pt call the office before 5 pm TOld her if he had swelling or chest pain or trouble  breathing would be even more concerning - referenced leg pain triage guide - it is in L calf    Call Id: [phone]

## 2015-08-23 NOTE — Telephone Encounter (Signed)
Called to follow up with patient.  Left a message for call back.   

## 2015-09-06 ENCOUNTER — Encounter: Payer: BLUE CROSS/BLUE SHIELD | Admitting: Internal Medicine

## 2015-09-10 ENCOUNTER — Telehealth: Payer: Self-pay

## 2015-09-10 ENCOUNTER — Encounter: Payer: BLUE CROSS/BLUE SHIELD | Admitting: Internal Medicine

## 2015-09-10 NOTE — Telephone Encounter (Signed)
FYI

## 2015-09-10 NOTE — Telephone Encounter (Signed)
Pt's wife, Cyndee Brightly, was seen for CPE today and informed us that Pt has been trying to call to schedule CPE appt but can not get appt soon. Per Dr. Larose Kells, okay to call Pt and schedule CPE and put 2 slots together to get Pt in at his convenience. Thank you.

## 2015-09-10 NOTE — Telephone Encounter (Signed)
Pt was called and scheduled for a cpe on September 23, 2015.

## 2015-09-24 ENCOUNTER — Encounter: Payer: Self-pay | Admitting: Internal Medicine

## 2015-09-24 ENCOUNTER — Ambulatory Visit (INDEPENDENT_AMBULATORY_CARE_PROVIDER_SITE_OTHER): Payer: BLUE CROSS/BLUE SHIELD | Admitting: Internal Medicine

## 2015-09-24 VITALS — BP 118/68 | HR 68 | Temp 98.1°F | Resp 12 | Ht 70.0 in | Wt 232.0 lb

## 2015-09-24 DIAGNOSIS — R7989 Other specified abnormal findings of blood chemistry: Secondary | ICD-10-CM | POA: Diagnosis not present

## 2015-09-24 DIAGNOSIS — S39012A Strain of muscle, fascia and tendon of lower back, initial encounter: Secondary | ICD-10-CM

## 2015-09-24 DIAGNOSIS — Z0000 Encounter for general adult medical examination without abnormal findings: Secondary | ICD-10-CM | POA: Diagnosis not present

## 2015-09-24 DIAGNOSIS — R945 Abnormal results of liver function studies: Secondary | ICD-10-CM

## 2015-09-24 LAB — CBC WITH DIFFERENTIAL/PLATELET
BASOS PCT: 1 %
Basophils Absolute: 67 cells/uL (ref 0–200)
EOS ABS: 402 {cells}/uL (ref 15–500)
Eosinophils Relative: 6 %
HCT: 46.2 % (ref 38.5–50.0)
Hemoglobin: 15.2 g/dL (ref 13.2–17.1)
LYMPHS PCT: 47 %
Lymphs Abs: 3149 cells/uL (ref 850–3900)
MCH: 29.3 pg (ref 27.0–33.0)
MCHC: 32.9 g/dL (ref 32.0–36.0)
MCV: 89.2 fL (ref 80.0–100.0)
MPV: 10.9 fL (ref 7.5–12.5)
Monocytes Absolute: 335 cells/uL (ref 200–950)
Monocytes Relative: 5 %
NEUTROS PCT: 41 %
Neutro Abs: 2747 cells/uL (ref 1500–7800)
Platelets: 149 10*3/uL (ref 140–400)
RBC: 5.18 MIL/uL (ref 4.20–5.80)
RDW: 14 % (ref 11.0–15.0)
WBC: 6.7 10*3/uL (ref 3.8–10.8)

## 2015-09-24 NOTE — Assessment & Plan Note (Addendum)
Td 08 Has gained weight. Patient is counseled about diet and exercise Labs reviewed: Check a CMP and CBC.

## 2015-09-24 NOTE — Progress Notes (Signed)
Pre visit review using our clinic review tool, if applicable. No additional management support is needed unless otherwise documented below in the visit note. 

## 2015-09-24 NOTE — Progress Notes (Signed)
Subjective:    Patient ID: Bradley James, male    DOB: Sep 17, 1980, 35 y.o.   MRN: [id number]  DOS:  09/24/2015 Type of visit - description : CPX Interval history: Has not been able to exercise lately, unable to lose weight  Wt Readings from Last 3 Encounters:  09/24/15 232 lb (105.2 kg)  09/02/14 232 lb 6 oz (105.4 kg)  02/23/14 238 lb 6 oz (108.1 kg)     Review of Systems  Constitutional: No fever. No chills. No unexplained wt changes. No unusual sweats  HEENT: No dental problems, no ear discharge, no facial swelling, no voice changes. No eye discharge, no eye  redness , no  intolerance to light   Respiratory: No wheezing , no  difficulty breathing. No cough , no mucus production  Cardiovascular: No CP, no leg swelling , no  Palpitations  GI: no nausea, no vomiting, no diarrhea , no  abdominal pain.  No blood in the stools. No dysphagia, no odynophagia    Endocrine: No polyphagia, no polyuria , no polydipsia  GU: No dysuria, gross hematuria, difficulty urinating. No urinary urgency, no frequency.  Musculoskeletal: occ low back pain without radiation, usually better with naproxen OTC  Skin: No change in the color of the skin, palor , no  Rash  Allergic, immunologic: No environmental allergies , no  food allergies  Neurological: No dizziness no  syncope. No headaches. No diplopia, no slurred, no slurred speech, no motor deficits, no facial  Numbness  Hematological: No enlarged lymph nodes, no easy bruising , no unusual bleedings  Psychiatry: No suicidal ideas, no hallucinations, no beavior problems, no confusion.  No unusual/severe anxiety, no depression  Past Medical History:  Diagnosis Date  . Elevated LFTs     Past Surgical History:  Procedure Laterality Date  . NO PAST SURGERIES      Social History   Social History  . Marital status: Married    Spouse name: N/A  . Number of children: 1  . Years of education: N/A   Occupational History  . machine  operator  [employer]   Social History Main Topics  . Smoking status: Never Smoker  . Smokeless tobacco: Never Used  . Alcohol use Yes     Comment: socially   . Drug use: No  . Sexual activity: Not on file   Other Topics Concern  . Not on file   Social History Narrative   Original from [place], married                     Family History  Problem Relation Age of Onset  . Cancer Paternal Grandfather     stomach, late in life  . Diabetes Maternal Grandfather   . Hypertension Other     aunt  . Hyperlipidemia Father     high TG  . Prostate cancer Neg Hx   . Heart attack Neg Hx   . Colon cancer Neg Hx       Medication List       Accurate as of 09/24/15 11:59 PM. Always use your most recent med list.          ketoconazole 2 % cream Commonly known as:  NIZORAL Apply 1 application topically daily.          Objective:   Physical Exam BP 118/68 (BP Location: Left Arm, Patient Position: Sitting, Cuff Size: Normal)   Pulse 68   Temp 98.1 F (36.7 C) (Oral)  Resp 12   Ht 5\' 10"  (1.778 m)   Wt 232 lb (105.2 kg)   SpO2 98%   BMI 33.29 kg/m   General:   Well developed, well nourished . NAD.  Neck: No  thyromegaly  HEENT:  Normocephalic . Face symmetric, atraumatic Lungs:  CTA B Normal respiratory effort, no intercostal retractions, no accessory muscle use. Heart: RRR,  no murmur.  No pretibial edema bilaterally  Abdomen:  Not distended, soft, non-tender. No rebound or rigidity.   Skin: Exposed areas without rash. Not pale. Not jaundice Neurologic:  alert & oriented X3.  Speech normal, gait appropriate for age and unassisted Strength symmetric and appropriate for age.  Psych: Cognition and judgment appear intact.  Cooperative with normal attention span and concentration.  Behavior appropriate. No anxious or depressed appearing.    Assessment & Plan:   Assessment Large mole, left leg, h/o (-) Bxs before Increase LFTs: Hep B and C negative  08-2014  Plan: Increase LFTs: Given increased weight over the last few years likely fatty liver. Will recheck LFTs, check a ceruloplasmin, iron and ferritin, liver ultrasound. Back pain: Recommend stretching, judicious use of naproxen OTC RTC one year

## 2015-09-24 NOTE — Patient Instructions (Signed)
GO TO THE LAB : Get the blood work     GO TO THE FRONT DESK Schedule your next appointment for a  Yearly check up in 1 year   [employer] with information about home physical therapy for back pain: [email or web address]     Naproxen 220 mg OTC no more than 2 tablets twice a day as needed

## 2015-09-26 DIAGNOSIS — Z09 Encounter for follow-up examination after completed treatment for conditions other than malignant neoplasm: Secondary | ICD-10-CM | POA: Insufficient documentation

## 2015-09-26 NOTE — Assessment & Plan Note (Signed)
Increase LFTs: Given increased weight over the last few years likely fatty liver. Will recheck LFTs, check a ceruloplasmin, iron and ferritin, liver ultrasound. Back pain: Recommend stretching, judicious use of naproxen OTC RTC one year

## 2015-09-27 LAB — CERULOPLASMIN: Ceruloplasmin: 22 mg/dL (ref 18–36)

## 2015-09-29 LAB — COMPREHENSIVE METABOLIC PANEL
ALT: 76 U/L — ABNORMAL HIGH (ref 9–46)
AST: 40 U/L (ref 10–40)
Albumin: 4.3 g/dL (ref 3.6–5.1)
Alkaline Phosphatase: 48 U/L (ref 40–115)
BUN: 20 mg/dL (ref 7–25)
CHLORIDE: 103 mmol/L (ref 98–110)
CO2: 26 mmol/L (ref 20–31)
CREATININE: 1.13 mg/dL (ref 0.60–1.35)
Calcium: 9.1 mg/dL (ref 8.6–10.3)
Glucose, Bld: 85 mg/dL (ref 65–99)
Potassium: 4.1 mmol/L (ref 3.5–5.3)
SODIUM: 138 mmol/L (ref 135–146)
Total Bilirubin: 0.5 mg/dL (ref 0.2–1.2)
Total Protein: 6.9 g/dL (ref 6.1–8.1)

## 2015-09-29 LAB — IBC PANEL
%SAT: 23 % (ref 15–60)
TIBC: 282 ug/dL (ref 250–425)
UIBC: 218 ug/dL (ref 125–400)

## 2015-09-29 LAB — IRON: Iron: 64 ug/dL (ref 50–180)

## 2015-09-30 ENCOUNTER — Ambulatory Visit (HOSPITAL_BASED_OUTPATIENT_CLINIC_OR_DEPARTMENT_OTHER)
Admission: RE | Admit: 2015-09-30 | Discharge: 2015-09-30 | Disposition: A | Discharge status: Home / Self Care | Payer: BLUE CROSS/BLUE SHIELD | Source: Ambulatory Visit | Attending: Internal Medicine | Admitting: Internal Medicine

## 2015-09-30 DIAGNOSIS — R7989 Other specified abnormal findings of blood chemistry: Secondary | ICD-10-CM | POA: Diagnosis not present

## 2015-09-30 DIAGNOSIS — R945 Abnormal results of liver function studies: Secondary | ICD-10-CM | POA: Diagnosis not present

## 2015-09-30 DIAGNOSIS — K760 Fatty (change of) liver, not elsewhere classified: Secondary | ICD-10-CM | POA: Insufficient documentation

## 2015-10-11 ENCOUNTER — Encounter: Payer: BLUE CROSS/BLUE SHIELD | Admitting: Internal Medicine

## 2016-02-02 ENCOUNTER — Encounter: Payer: BLUE CROSS/BLUE SHIELD | Admitting: Internal Medicine

## 2016-04-22 DIAGNOSIS — R300 Dysuria: Secondary | ICD-10-CM | POA: Diagnosis not present

## 2016-04-24 ENCOUNTER — Encounter: Payer: Self-pay | Admitting: Internal Medicine

## 2016-04-24 ENCOUNTER — Ambulatory Visit (INDEPENDENT_AMBULATORY_CARE_PROVIDER_SITE_OTHER): Payer: BLUE CROSS/BLUE SHIELD | Admitting: Internal Medicine

## 2016-04-24 VITALS — BP 124/78 | HR 69 | Temp 97.8°F | Resp 12 | Ht 70.0 in | Wt 236.0 lb

## 2016-04-24 DIAGNOSIS — H00012 Hordeolum externum right lower eyelid: Secondary | ICD-10-CM | POA: Diagnosis not present

## 2016-04-24 MED ORDER — GENTAMICIN SULFATE 0.3 % OP OINT: TOPICAL_OINTMENT | Freq: Three times a day (TID) | OPHTHALMIC | 0 refills | Status: DC

## 2016-04-24 NOTE — Patient Instructions (Signed)
Apply gentamycin 3 times a day x 5 days  Warm compress  Call if not gradually better or if it get worse

## 2016-04-24 NOTE — Progress Notes (Signed)
   Subjective:    Patient ID: Bradley James, male    DOB: 1980/07/06, 36 y.o.   MRN: [id number]  DOS:  04/24/2016 Type of visit - description : acute OV  Interval history: 2 days ago developed pain and swelling at the right eye, using a topical abx he has from [place], area looks slightly better but not completely well.. Also, last week had some difficulty urinating but denies any dysuria, gross hematuria, fever chills, nausea ; did have some suprapubic discomfort. Went to urgent care, urinalysis was negative, he is now completely asymptomatic.    Review of Systems Denies any visual disturbances or red eye.  Past Medical History:  Diagnosis Date  . Elevated LFTs     Past Surgical History:  Procedure Laterality Date  . NO PAST SURGERIES      Social History   Social History  . Marital status: Married    Spouse name: N/A  . Number of children: 1  . 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 Smoking status: Never Smoker  . Smokeless tobacco: Never Used  . Alcohol use Yes     Comment: socially   . Drug use: No  . Sexual activity: Not on file   Other Topics Concern  . Not on file   Social History Narrative   Original from [place], married                      Allergies as of 04/24/2016   No Known Allergies     Medication List       Accurate as of 04/24/16  3:01 PM. Always use your most recent med list.          ketoconazole 2 % cream Commonly known as:  NIZORAL Apply 1 application topically daily.          Objective:   Physical Exam  Eyes:     BP 124/78 (BP Location: Left Arm, Patient Position: Sitting, Cuff Size: Normal)   Pulse 69   Temp 97.8 F (36.6 C) (Oral)   Resp 12   Ht 5\' 10"  (1.778 m)   Wt 236 lb (107 kg)   SpO2 97%   BMI 33.86 kg/m  General:   Well developed, well nourished . NAD.  HEENT:  Normocephalic . Face symmetric, atraumatic  EOMI, pupils equal and  reactive,conjunctivas not injected Skin: Not pale. Not jaundice Neurologic:  alert & oriented X3.  Speech normal, gait appropriate for age and unassisted Psych--  Cognition and judgment appear intact.  Cooperative with normal attention span and concentration.  Behavior appropriate. No anxious or depressed appearing.      Assessment & Plan:   Assessment Large mole, left leg, h/o (-) Bxs before Increase LFTs: Hep B and C negative 08-2014. 08-2015: Normal ceruloplasmin, iron; [place]: Fatty liver  Plan: Hordeolum: Recommend warm compress, gentamicin, call if no better. Episode of dysuria: went to a UC last week, UA negative, assx now. Rec observation.

## 2016-04-24 NOTE — Assessment & Plan Note (Signed)
Hordeolum: Recommend warm compress, gentamicin, call if no better. Episode of dysuria: went to a UC last week, UA negative, assx now. Rec observation.

## 2016-04-24 NOTE — Progress Notes (Signed)
Pre visit review using our clinic review tool, if applicable. No additional management support is needed unless otherwise documented below in the visit note. 

## 2016-04-26 ENCOUNTER — Telehealth: Payer: Self-pay

## 2016-04-26 MED ORDER — CIPROFLOXACIN HCL 0.3 % OP OINT: TOPICAL_OINTMENT | Freq: Three times a day (TID) | OPHTHALMIC | 0 refills | Status: DC

## 2016-04-26 NOTE — Telephone Encounter (Signed)
Gentamicin ophthalmic ointment unable. Per PCP okay to change to Ciloxan ophthalmic ointment 0.3 % TID for 3 days. Rx sent to [facility].

## 2016-05-22 ENCOUNTER — Encounter: Payer: Self-pay | Admitting: Internal Medicine

## 2016-05-22 DIAGNOSIS — Z3009 Encounter for other general counseling and advice on contraception: Secondary | ICD-10-CM

## 2016-05-23 NOTE — Telephone Encounter (Signed)
Enter a urology referral, vasectomy  Received: [place], [place]  Damita [place], [place]

## 2016-05-23 NOTE — Telephone Encounter (Signed)
Urology referral placed

## 2016-08-02 DIAGNOSIS — Z3009 Encounter for other general counseling and advice on contraception: Secondary | ICD-10-CM | POA: Diagnosis not present

## 2016-09-26 ENCOUNTER — Encounter: Payer: BLUE CROSS/BLUE SHIELD | Admitting: Internal Medicine

## 2016-09-26 ENCOUNTER — Encounter: Payer: Self-pay | Admitting: Internal Medicine

## 2016-09-26 ENCOUNTER — Ambulatory Visit (INDEPENDENT_AMBULATORY_CARE_PROVIDER_SITE_OTHER): Payer: BLUE CROSS/BLUE SHIELD | Admitting: Internal Medicine

## 2016-09-26 VITALS — BP 126/78 | HR 99 | Temp 98.1°F | Resp 14 | Ht 70.0 in | Wt 226.1 lb

## 2016-09-26 DIAGNOSIS — L255 Unspecified contact dermatitis due to plants, except food: Secondary | ICD-10-CM

## 2016-09-26 MED ORDER — PREDNISONE 10 MG PO TABS: ORAL_TABLET | ORAL | 0 refills | Status: DC

## 2016-09-26 MED ORDER — BETAMETHASONE DIPROPIONATE AUG 0.05 % EX CREA: TOPICAL_CREAM | Freq: Two times a day (BID) | CUTANEOUS | 0 refills | Status: DC

## 2016-09-26 NOTE — Patient Instructions (Signed)
Take prednisone as prescribed  Apply the cream twice a day until better  Call if not improving   Poison Ivy Dermatitis Poison ivy dermatitis is redness and soreness (inflammation) of the skin. It is caused by a chemical that is found on the leaves of the poison ivy plant. You may also have itching, a rash, and blisters. Symptoms often clear up in 1-2 weeks. You may get this condition by touching a poison ivy plant. You can also get it by touching something that has the chemical on it. This may include animals or objects that have come in contact with the plant. Follow these instructions at home: General instructions  Take or apply over-the-counter and prescription medicines only as told by your doctor.  If you touch poison ivy, wash your skin with soap and cold water right away.  Use hydrocortisone creams or calamine lotion as needed to help with itching.  Take oatmeal baths as needed. Use colloidal oatmeal. You can get this at a pharmacy or grocery store. Follow the instructions on the package.  Do not scratch or rub your skin.  While you have the rash, wash your clothes right after you wear them. Prevention  Know what poison ivy looks like so you can avoid it. This plant has three leaves with flowering branches on a single stem. The leaves are glossy. They have uneven edges that come to a point at the front.  If you have touched poison ivy, wash with soap and water right away. Be sure to wash under your fingernails.  When hiking or camping, wear long pants, a long-sleeved shirt, tall socks, and hiking boots. You can also use a lotion on your skin that helps to prevent contact with the chemical on the plant.  If you think that your clothes or outdoor gear came in contact with poison ivy, rinse them off with a garden hose before you bring them inside your house. Contact a doctor if:  You have open sores in the rash area.  You have more redness, swelling, or pain in the affected  area.  You have redness that spreads beyond the rash area.  You have fluid, blood, or pus coming from the affected area.  You have a fever.  You have a rash over a large area of your body.  You have a rash on your eyes, mouth, or genitals.  Your rash does not get better after a few days. Get help right away if:  Your face swells or your eyes swell shut.  You have trouble breathing.  You have trouble swallowing. This information is not intended to replace advice given to you by your health care provider. Make sure you discuss any questions you have with your health care provider. Document Released: 03/18/2010 Document Revised: 07/22/2015 Document Reviewed: 07/22/2014 Elsevier Interactive Patient Education  [employer].

## 2016-09-26 NOTE — Progress Notes (Signed)
Pre visit review using our clinic review tool, if applicable. No additional management support is needed unless otherwise documented below in the visit note. 

## 2016-09-26 NOTE — Progress Notes (Signed)
   Subjective:    Patient ID: Bradley James, male    DOB: [birth year], 36 y.o.   MRN: [id number]  DOS:  09/26/2016 Type of visit - description : acute Interval history: Patient did some yard work 2 weeks ago and was exposed to poison ivy Few days later developed an itchy rash at the lower extremities and arms, worse on the left. His son had H-F-M disease few weeks ago but he is now asymptomatic; concern about H-F-M dz   Review of Systems Denies fever chills. No headaches. No nausea, vomiting. No arthralgias.  Past Medical History:  Diagnosis Date  . Elevated LFTs     Past Surgical History:  Procedure Laterality Date  . NO PAST SURGERIES      Social History   Social History  . Marital status: Married    Spouse name: N/A  . Number of children: 1  . 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 Smoking status: Never Smoker  . Smokeless tobacco: Never Used  . Alcohol use Yes     Comment: socially   . Drug use: No  . Sexual activity: Not on file   Other Topics Concern  . Not on file   Social History Narrative   Original from [place], married                      Allergies as of 09/26/2016   No Known Allergies     Medication List       Accurate as of 09/26/16 11:59 PM. Always use your most recent med list.          augmented betamethasone dipropionate 0.05 % cream Commonly known as:  DIPROLENE-AF Apply topically 2 (two) times daily.   predniSONE 10 MG tablet Commonly known as:  DELTASONE 3 tabs x 2 days, 2 tabs x 2 days, 1 tab x 2 days          Objective:   Physical Exam BP 126/78 (BP Location: Left Arm, Patient Position: Sitting, Cuff Size: Normal)   Pulse 99   Temp 98.1 F (36.7 C) (Oral)   Resp 14   Ht 5\' 10"  (1.778 m)   Wt 226 lb 2 oz (102.6 kg)   SpO2 99%   BMI 32.45 kg/m  General:   Well developed, well nourished . NAD.  HEENT:  Normocephalic . Face symmetric,  atraumatic Skin:  Few patches with erythematous skin and very small blisters at the pretibial areas and arms, worse on the left arm, all rash is distal from the elbow. Neurologic:  alert & oriented X3.  Speech normal, gait appropriate for age and unassisted Psych--  Cognition and judgment appear intact.  Cooperative with normal attention span and concentration.  Behavior appropriate. No anxious or depressed appearing.      Assessment & Plan:  Assessment Large mole, left leg, h/o (-) Bxs before Increase LFTs: Hep B and C negative 08-2014. 08-2015: Normal ceruloplasmin, iron; [place]: Fatty liver  Plan: Contact dermatitis: rash most likely due to contact dermatitis, no systemic sxs. Recommend prednisone for a few days, avoidance of poison ivy, wash all clothing, topical steroids. Call if no better.

## 2016-09-27 NOTE — Assessment & Plan Note (Signed)
Contact dermatitis: rash most likely due to contact dermatitis, no systemic sxs. Recommend prednisone for a few days, avoidance of poison ivy, wash all clothing, topical steroids. Call if no better.

## 2016-10-03 ENCOUNTER — Encounter: Payer: Self-pay | Admitting: Internal Medicine

## 2016-10-03 ENCOUNTER — Ambulatory Visit (INDEPENDENT_AMBULATORY_CARE_PROVIDER_SITE_OTHER): Payer: BLUE CROSS/BLUE SHIELD | Admitting: Internal Medicine

## 2016-10-03 VITALS — BP 126/74 | HR 74 | Temp 98.3°F | Resp 14 | Ht 70.0 in | Wt 233.0 lb

## 2016-10-03 DIAGNOSIS — Z0000 Encounter for general adult medical examination without abnormal findings: Secondary | ICD-10-CM

## 2016-10-03 MED ORDER — BETAMETHASONE DIPROPIONATE AUG 0.05 % EX CREA: TOPICAL_CREAM | Freq: Two times a day (BID) | CUTANEOUS | 1 refills | Status: DC

## 2016-10-03 MED ORDER — PREDNISONE 10 MG PO TABS: ORAL_TABLET | ORAL | 0 refills | Status: DC

## 2016-10-03 NOTE — Patient Instructions (Signed)
  GO TO THE FRONT DESK Schedule labs to be on this week, fasting.  Schedule your next appointment for a  physical exam in one year    Oral prednisone for 9 days  Continued using the cream twice a day as needed  Benadryl at bedtime for itching  Call if not back to normal in a couple of weeks

## 2016-10-03 NOTE — Progress Notes (Signed)
Subjective:    Patient ID: Bradley James, male    DOB: February 16, 1981, 36 y.o.   MRN: [id number]  DOS:  10/03/2016 Type of visit - description :  CPX Interval history: Has gained weight. Has 2 children, one is a newborn, has very little time to exercise. Was seen with poison ivy, symptoms decreased temporarily but had 2 new rashes  on the abdomen.  Wt Readings from Last 3 Encounters:  10/03/16 233 lb (105.7 kg)  09/26/16 226 lb 2 oz (102.6 kg)  04/24/16 236 lb (107 kg)     Review of Systems No fever, chills or unusual aches or pains   Other than above, a 14 point review of systems is negative    Past Medical History:  Diagnosis Date  . Elevated LFTs     Past Surgical History:  Procedure Laterality Date  . NO PAST SURGERIES      Social History   Social History  . Marital status: Married    Spouse name: N/A  . Number of children: 2  . 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 Smoking status: Never Smoker  . Smokeless tobacco: Never Used  . Alcohol use Yes     Comment: socially   . Drug use: No  . Sexual activity: Not on file   Other Topics Concern  . Not on file   Social History Narrative   Original from [place], married                     Family History  Problem Relation Age of Onset  . Cancer Paternal Grandfather        stomach, late in life  . Diabetes Maternal Grandfather   . Hypertension Other        aunt  . Hyperlipidemia Father        high TG  . Prostate cancer Neg Hx   . Heart attack Neg Hx   . Colon cancer Neg Hx     Allergies as of 10/03/2016   No Known Allergies     Medication List       Accurate as of 10/03/16 11:59 PM. Always use your most recent med list.          augmented betamethasone dipropionate 0.05 % cream Commonly known as:  DIPROLENE-AF Apply topically 2 (two) times daily.   predniSONE 10 MG tablet Commonly known as:  DELTASONE 3 tabs x 3 days,  2 tabs x 3 days, 1 tab x 3 days          Objective:   Physical Exam BP 126/74 (BP Location: Left Arm, Patient Position: Sitting, Cuff Size: Normal)   Pulse 74   Temp 98.3 F (36.8 C) (Oral)   Resp 14   Ht 5\' 10"  (1.778 m)   Wt 233 lb (105.7 kg)   SpO2 98%   BMI 33.43 kg/m   General:   Well developed, well nourished . NAD.  Neck: No  thyromegaly  HEENT:  Normocephalic . Face symmetric, atraumatic Lungs:  CTA B Normal respiratory effort, no intercostal retractions, no accessory muscle use. Heart: RRR,  no murmur.  No pretibial edema bilaterally  Abdomen:  Not distended, soft, non-tender. No rebound or rigidity.   Skin: Lesions observe few days ago: Some are drying up, others still red. Has a couple of new lesions at the abdomen. Neurologic:  alert & oriented X3.  Speech normal, gait appropriate for age and unassisted Strength symmetric and appropriate for age.  Psych: Cognition and judgment appear intact.  Cooperative with normal attention span and concentration.  Behavior appropriate. No anxious or depressed appearing.    Assessment & Plan:    Assessment Large mole, left leg, h/o (-) Bxs before Increase LFTs: Hep B and C negative 08-2014. 08-2015: Normal ceruloplasmin, iron; [place]: Fatty liver  Plan: Contact dermatitis: Still itching. Will provide a second round of prednisone this time for 9 days. Refill topical steroids. Call if not better. Increase LFTs: Likely due to fatty liver weight loss recommended. RTC one year

## 2016-10-03 NOTE — Progress Notes (Signed)
Pre visit review using our clinic review tool, if applicable. No additional management support is needed unless otherwise documented below in the visit note. 

## 2016-10-03 NOTE — Assessment & Plan Note (Signed)
-  Td 2015 -labs: Will come back fasting for a CMP, FLP, TSH -Diet and exercise discussed 

## 2016-10-04 ENCOUNTER — Other Ambulatory Visit: Payer: BLUE CROSS/BLUE SHIELD

## 2016-10-04 NOTE — Assessment & Plan Note (Signed)
Contact dermatitis: Still itching. Will provide a second round of prednisone this time for 9 days. Refill topical steroids. Call if not better. Increase LFTs: Likely due to fatty liver weight loss recommended. RTC one year

## 2016-10-05 ENCOUNTER — Other Ambulatory Visit (INDEPENDENT_AMBULATORY_CARE_PROVIDER_SITE_OTHER): Payer: BLUE CROSS/BLUE SHIELD

## 2016-10-05 ENCOUNTER — Telehealth: Payer: Self-pay | Admitting: Internal Medicine

## 2016-10-05 DIAGNOSIS — Z0000 Encounter for general adult medical examination without abnormal findings: Secondary | ICD-10-CM

## 2016-10-05 LAB — COMPREHENSIVE METABOLIC PANEL
ALBUMIN: 4.7 g/dL (ref 3.5–5.2)
ALT: 56 U/L — ABNORMAL HIGH (ref 0–53)
AST: 34 U/L (ref 0–37)
Alkaline Phosphatase: 51 U/L (ref 39–117)
BUN: 20 mg/dL (ref 6–23)
CALCIUM: 9.7 mg/dL (ref 8.4–10.5)
CHLORIDE: 105 meq/L (ref 96–112)
CO2: 27 mEq/L (ref 19–32)
Creatinine, Ser: 1.22 mg/dL (ref 0.40–1.50)
GFR: 70.85 mL/min (ref >60.00)
Glucose, Bld: 98 mg/dL (ref 70–99)
POTASSIUM: 4.1 meq/L (ref 3.5–5.1)
Sodium: 139 mEq/L (ref 135–145)
Total Bilirubin: 0.7 mg/dL (ref 0.2–1.2)
Total Protein: 7.6 g/dL (ref 6.0–8.3)

## 2016-10-05 LAB — LIPID PANEL
Cholesterol: 201 mg/dL — ABNORMAL HIGH (ref 0–200)
HDL: 54.4 mg/dL
LDL Cholesterol: 124 mg/dL — ABNORMAL HIGH (ref 0–99)
NonHDL: 146.86
Total CHOL/HDL Ratio: 4
Triglycerides: 116 mg/dL (ref 0.0–149.0)
VLDL: 23.2 mg/dL (ref 0.0–40.0)

## 2016-10-05 LAB — TSH: TSH: 5.46 u[IU]/mL — ABNORMAL HIGH (ref 0.35–4.50)

## 2016-10-05 MED ORDER — HYDROCORTISONE 2.5 % EX CREA
TOPICAL_CREAM | Freq: Two times a day (BID) | CUTANEOUS | 0 refills | Status: AC
Start: 2016-10-05 — End: 2017-10-05

## 2016-10-05 NOTE — Telephone Encounter (Signed)
Caller name: Banyan   Relation to pt: self Call back number: [phone] Pharmacy: [facility], [place] - [address] AT [place]  Reason for call: Pt came in office for a lab appt and stated that he had issue at the pharmacy getting the rx for augmented betamethasone dipropionate (DIPROLENE-AF) 0.05 % cream, pharmacist stated that insurance will not cover rx since he was prescribed before and insurance will only cover this rx for only one time (pt informed this). Pt would like to know if he can get another rx that his insurance will cover. Please advise.

## 2016-10-05 NOTE — Telephone Encounter (Signed)
LMOM informing Pt that Hydrocortisone has been sent to [facility]. Instructed to call if questions/concerns.

## 2016-10-05 NOTE — Telephone Encounter (Signed)
Please advise 

## 2016-10-05 NOTE — Telephone Encounter (Signed)
Hydrocortisone 2.5% sent

## 2017-06-03 ENCOUNTER — Encounter: Payer: Self-pay | Admitting: Internal Medicine

## 2017-06-04 ENCOUNTER — Other Ambulatory Visit: Payer: Self-pay

## 2017-06-04 DIAGNOSIS — R7989 Other specified abnormal findings of blood chemistry: Secondary | ICD-10-CM

## 2017-10-05 ENCOUNTER — Encounter: Payer: BLUE CROSS/BLUE SHIELD | Admitting: Internal Medicine

## 2017-10-19 ENCOUNTER — Ambulatory Visit (INDEPENDENT_AMBULATORY_CARE_PROVIDER_SITE_OTHER): Payer: BLUE CROSS/BLUE SHIELD | Admitting: Internal Medicine

## 2017-10-19 ENCOUNTER — Encounter: Payer: Self-pay | Admitting: Internal Medicine

## 2017-10-19 VITALS — BP 116/76 | HR 74 | Temp 97.9°F | Resp 16 | Ht 70.0 in | Wt 230.0 lb

## 2017-10-19 DIAGNOSIS — Z0000 Encounter for general adult medical examination without abnormal findings: Secondary | ICD-10-CM | POA: Diagnosis not present

## 2017-10-19 NOTE — Progress Notes (Signed)
Subjective:    Patient ID: Bradley James, male    DOB: [birth year], 37 y.o.   MRN: [id number]  DOS:  10/19/2017 Type of visit - description : cpx Interval history:  Since last year, he is doing well  Review of Systems  Reports 4-week history of on and off pain, located at the low left back with occasional radiation to the left hip. Is worse when he is active or bends his torso.  Decreased when he lays down. Also worse when he does some lifting at work.  No lower extremity paresthesias.  Other than above, a 14 point review of systems is negative      Past Medical History:  Diagnosis Date  . Elevated LFTs     Past Surgical History:  Procedure Laterality Date  . NO PAST SURGERIES      Social History   Socioeconomic History  . Marital status: Married    Spouse name: Not on file  . Number of children: 2  . Years of education: Not on file  . Highest education level: Not on file  Occupational History  . Occupation: Glass blower/designer     Employer: [employer]  . Financial resource strain: Not on file  . Food insecurity:    Worry: Not on file    Inability: Not on file  . Transportation needs:    Medical: Not on file    Non-medical: Not on file  Tobacco Use  . Smoking status: Never Smoker  . Smokeless tobacco: Never Used  Substance and Sexual Activity  . Alcohol use: Yes    Comment: socially   . Drug use: No  . Sexual activity: Not on file  Lifestyle  . Physical activity:    Days per week: Not on file    Minutes per session: Not on file  . Stress: Not on file  Relationships  . Social connections:    Talks on phone: Not on file    Gets together: Not on file    Attends religious service: Not on file    Active member of club or organization: Not on file    Attends meetings of clubs or organizations: Not on file    Relationship status: Not on file  . Intimate partner violence:    Fear of current or ex partner: Not on file    Emotionally abused: Not on  file    Physically abused: Not on file    Forced sexual activity: Not on file  Other Topics Concern  . Not on file  Social History Narrative   Original from [place], married                  Family History  Problem Relation Age of Onset  . Cancer Paternal Grandfather        stomach, late in life  . Diabetes Maternal Grandfather   . Hypertension Other        aunt  . Hyperlipidemia Father        high TG  . CAD Father   . Prostate cancer Neg Hx   . Colon cancer Neg Hx      Allergies as of 10/19/2017   No Known Allergies     Medication List    as of 10/19/2017 11:59 PM   You have not been prescribed any medications.        Objective:   Physical Exam BP 116/76 (BP Location: Right Arm, Patient Position: Sitting, Cuff Size: Normal)  Pulse 74   Temp 97.9 F (36.6 C) (Oral)   Resp 16   Ht 5\' 10"  (1.778 m)   Wt 230 lb (104.3 kg)   SpO2 96%   BMI 33.00 kg/m  General: Well developed, NAD, see BMI.  Neck: No  thyromegaly  HEENT:  Normocephalic . Face symmetric, atraumatic Lungs:  CTA B Normal respiratory effort, no intercostal retractions, no accessory muscle use. Heart: RRR,  no murmur.  No pretibial edema bilaterally  Abdomen:  Not distended, soft, non-tender. No rebound or rigidity. MSK: Hip rotation normal bilaterally, straight leg test negative. Skin: Exposed areas without rash. Not pale. Not jaundice Neurologic:  alert & oriented X3.  Speech normal, gait appropriate for age and unassisted Strength symmetric and appropriate for age.  Motor and DTR symmetric. Psych: Cognition and judgment appear intact.  Cooperative with normal attention span and concentration.  Behavior appropriate. No anxious or depressed appearing.     Assessment & Plan:    Assessment Large mole, left leg, h/o (-) Bxs before Increase LFTs: Hep B and C negative 08-2014. 08-2015: Normal ceruloplasmin, iron; [place]: Fatty liver  Plan: Increased TSH: See last labs, will recheck  today.  Denies symptoms such as fatigue, constipation, etc. Back pain: As described above, recommend stretching before going to work, Tylenol Motrin judiciously.  Call if not better. RTC 1 year

## 2017-10-19 NOTE — Patient Instructions (Addendum)
GO TO THE LAB : Get the blood work     GO TO THE FRONT DESK Schedule your next appointment for a  PHYSICAL EXAM IN 1 YEAR  For back pain: --Stretch twice a day --Take other Tylenol or ibuprofen OTC sporadically, see below. --Call if not gradually better in the next couple of months  Tylenol  500 mg OTC 2 tabs a day every 8 hours as needed for pain  IBUPROFEN (Advil or Motrin) 200 mg 2 tablets every 6 hours as needed for pain.  Always take it with food because may cause gastritis and ulcers.  If you notice nausea, stomach pain, change in the color of stools --->  Stop the medicine and let us know

## 2017-10-19 NOTE — Assessment & Plan Note (Signed)
-  Td 2015 -labs: Will come back fasting for a CMP, FLP, TSH -Diet and exercise discussed

## 2017-10-20 LAB — LIPID PANEL
Cholesterol: 200 mg/dL — ABNORMAL HIGH (ref <200)
HDL: 46 mg/dL (ref >40)
LDL CHOLESTEROL (CALC): 124 mg/dL — AB
NON-HDL CHOLESTEROL (CALC): 154 mg/dL — AB (ref <130)
TRIGLYCERIDES: 178 mg/dL — AB (ref <150)
Total CHOL/HDL Ratio: 4.3 (calc) (ref <5.0)

## 2017-10-20 LAB — CBC WITH DIFFERENTIAL/PLATELET
BASOS ABS: 72 {cells}/uL (ref 0–200)
Basophils Relative: 1.1 %
EOS PCT: 5 %
Eosinophils Absolute: 325 cells/uL (ref 15–500)
HCT: 44.9 % (ref 38.5–50.0)
HEMOGLOBIN: 15.1 g/dL (ref 13.2–17.1)
Lymphs Abs: 2789 cells/uL (ref 850–3900)
MCH: 29.1 pg (ref 27.0–33.0)
MCHC: 33.6 g/dL (ref 32.0–36.0)
MCV: 86.5 fL (ref 80.0–100.0)
MONOS PCT: 6.5 %
MPV: 10.7 fL (ref 7.5–12.5)
NEUTROS ABS: 2893 {cells}/uL (ref 1500–7800)
NEUTROS PCT: 44.5 %
Platelets: 151 10*3/uL (ref 140–400)
RBC: 5.19 10*6/uL (ref 4.20–5.80)
RDW: 13.6 % (ref 11.0–15.0)
Total Lymphocyte: 42.9 %
WBC mixed population: 423 cells/uL (ref 200–950)
WBC: 6.5 10*3/uL (ref 3.8–10.8)

## 2017-10-20 LAB — T3, FREE: T3, Free: 3.5 pg/mL (ref 2.3–4.2)

## 2017-10-20 LAB — COMPREHENSIVE METABOLIC PANEL
AG Ratio: 1.7 (calc) (ref 1.0–2.5)
ALT: 48 U/L — AB (ref 9–46)
AST: 30 U/L (ref 10–40)
Albumin: 4.3 g/dL (ref 3.6–5.1)
Alkaline phosphatase (APISO): 50 U/L (ref 40–115)
BUN: 18 mg/dL (ref 7–25)
CO2: 27 mmol/L (ref 20–32)
CREATININE: 1.1 mg/dL (ref 0.60–1.35)
Calcium: 9.3 mg/dL (ref 8.6–10.3)
Chloride: 105 mmol/L (ref 98–110)
GLUCOSE: 86 mg/dL (ref 65–99)
Globulin: 2.5 g/dL (calc) (ref 1.9–3.7)
Potassium: 4.2 mmol/L (ref 3.5–5.3)
SODIUM: 141 mmol/L (ref 135–146)
TOTAL PROTEIN: 6.8 g/dL (ref 6.1–8.1)
Total Bilirubin: 0.5 mg/dL (ref 0.2–1.2)

## 2017-10-20 LAB — TSH: TSH: 3.7 mIU/L (ref 0.40–4.50)

## 2017-10-20 LAB — T4, FREE: Free T4: 0.9 ng/dL (ref 0.8–1.8)

## 2017-10-20 NOTE — Assessment & Plan Note (Signed)
Increased TSH: See last labs, will recheck today.  Denies symptoms such as fatigue, constipation, etc. Back pain: As described above, recommend stretching before going to work, Tylenol Motrin judiciously.  Call if not better. RTC 1 year

## 2018-09-26 ENCOUNTER — Ambulatory Visit (INDEPENDENT_AMBULATORY_CARE_PROVIDER_SITE_OTHER): Payer: BC Managed Care – PPO | Admitting: Internal Medicine

## 2018-09-26 ENCOUNTER — Encounter: Payer: Self-pay | Admitting: Internal Medicine

## 2018-09-26 ENCOUNTER — Other Ambulatory Visit: Payer: Self-pay

## 2018-09-26 VITALS — BP 127/69 | HR 72 | Temp 98.7°F | Resp 16 | Ht 70.0 in | Wt 245.0 lb

## 2018-09-26 DIAGNOSIS — S300XXA Contusion of lower back and pelvis, initial encounter: Secondary | ICD-10-CM | POA: Diagnosis not present

## 2018-09-26 NOTE — Progress Notes (Signed)
Pre visit review using our clinic review tool, if applicable. No additional management support is needed unless otherwise documented below in the visit note. 

## 2018-09-26 NOTE — Patient Instructions (Signed)
ICE pack twice a day  doughnut pillow   Tylenol  500 mg OTC 2 tabs a day every 8 hours as needed for pain  IBUPROFEN (Advil or Motrin) 200 mg 2 tablets every 8 hours as needed for pain.  Always take it with food because may cause gastritis and ulcers.  If you notice nausea, stomach pain, change in the color of stools --->  Stop the medicine and let us know

## 2018-09-26 NOTE — Progress Notes (Signed)
Subjective:    Patient ID: Bradley James, male    DOB: Jan 17, 1981, 38 y.o.   MRN: [id number]  DOS:  09/26/2018 Type of visit - description: acute 4 days ago, he was playing at a river with his children and he slip[ped and fell, landed on his buttocks. Denies head or neck injury Since then, is having pain at the distal lower back depending on certain positions specifically when he sits in a chair. When he get up from the chair he has pain but he is able to walk okay. No problems when he lies down in bed.  Review of Systems No bladder or bowel incontinence No lower extremity paresthesias  Past Medical History:  Diagnosis Date  . Elevated LFTs     Past Surgical History:  Procedure Laterality Date  . NO PAST SURGERIES      Social History   Socioeconomic History  . Marital status: Married    Spouse name: Not on file  . Number of children: 2  . Years of education: Not on file  . Highest education level: Not on file  Occupational History  . Occupation: Glass blower/designer     Employer: [employer]  . Financial resource strain: Not on file  . Food insecurity    Worry: Not on file    Inability: Not on file  . Transportation needs    Medical: Not on file    Non-medical: Not on file  Tobacco Use  . Smoking status: Never Smoker  . Smokeless tobacco: Never Used  Substance and Sexual Activity  . Alcohol use: Yes    Comment: socially   . Drug use: No  . Sexual activity: Not on file  Lifestyle  . Physical activity    Days per week: Not on file    Minutes per session: Not on file  . Stress: Not on file  Relationships  . Social Herbalist on phone: Not on file    Gets together: Not on file    Attends religious service: Not on file    Active member of club or organization: Not on file    Attends meetings of clubs or organizations: Not on file    Relationship status: Not on file  . Intimate partner violence    Fear of current or ex partner: Not on file    Emotionally abused: Not on file    Physically abused: Not on file    Forced sexual activity: Not on file  Other Topics Concern  . Not on file  Social History Narrative   Original from [place], married                   Allergies as of 09/26/2018   No Known Allergies     Medication List    as of September 26, 2018  1:29 PM   You have not been prescribed any medications.         Objective:   Physical Exam Skin:        BP 127/69 (BP Location: Left Arm, Patient Position: Sitting, Cuff Size: Normal)   Pulse 72   Temp 98.7 F (37.1 C) (Oral)   Resp 16   Ht 5\' 10"  (1.778 m)   Wt 245 lb (111.1 kg)   SpO2 100%   BMI 35.15 kg/m  General:   Well developed, NAD, BMI noted. HEENT:  Normocephalic . Face symmetric, atraumatic MSK: See graphic Neurologic:  alert & oriented X3.  Speech normal, gait slightly antalgic, transferring is also slightly antalgic. DTRs symmetric Psych--  Cognition and judgment appear intact.  Cooperative with normal attention span and concentration.  Behavior appropriate. No anxious or depressed appearing.      Assessment    Assessment Large mole, left leg, h/o (-) Bxs before Increase LFTs: Hep B and C negative 08-2014. 08-2015: Normal ceruloplasmin, iron; [place]: Fatty liver  Plan: Contusion, distal low back: Patient has a contusion, no neurological symptoms, neuro exam normal. Advised patient that this pain may last few weeks. We talk about x-rays to rule out fracture but we agreed on waiting and see how he is doing.  Will call for x-ray if not gradually improving. Otherwise recommend ice, alternate Tylenol and ibuprofen, side effects discussed, donut pillow, see AVS

## 2018-09-27 NOTE — Assessment & Plan Note (Signed)
Contusion, distal low back: Patient has a contusion, no neurological symptoms, neuro exam normal. Advised patient that this pain may last few weeks. We talk about x-rays to rule out fracture but we agreed on waiting and see how he is doing.  Will call for x-ray if not gradually improving. Otherwise recommend ice, alternate Tylenol and ibuprofen, side effects discussed, donut pillow, see AVS

## 2018-10-23 ENCOUNTER — Encounter: Payer: BLUE CROSS/BLUE SHIELD | Admitting: Internal Medicine

## 2018-10-24 ENCOUNTER — Encounter: Payer: Self-pay | Admitting: Internal Medicine

## 2018-10-24 ENCOUNTER — Ambulatory Visit (INDEPENDENT_AMBULATORY_CARE_PROVIDER_SITE_OTHER): Payer: BC Managed Care – PPO | Admitting: Internal Medicine

## 2018-10-24 ENCOUNTER — Telehealth: Payer: Self-pay

## 2018-10-24 ENCOUNTER — Other Ambulatory Visit: Payer: Self-pay

## 2018-10-24 VITALS — BP 119/68 | HR 79 | Temp 96.4°F | Resp 18 | Ht 70.0 in | Wt 242.0 lb

## 2018-10-24 DIAGNOSIS — Z0000 Encounter for general adult medical examination without abnormal findings: Secondary | ICD-10-CM

## 2018-10-24 DIAGNOSIS — Z23 Encounter for immunization: Secondary | ICD-10-CM | POA: Diagnosis not present

## 2018-10-24 LAB — CBC WITH DIFFERENTIAL/PLATELET
Basophils Absolute: 0 10*3/uL (ref 0.0–0.1)
Basophils Relative: 1 % (ref 0.0–3.0)
Eosinophils Absolute: 0.2 10*3/uL (ref 0.0–0.7)
Eosinophils Relative: 4.7 % (ref 0.0–5.0)
HCT: 44.3 % (ref 39.0–52.0)
Hemoglobin: 14.8 g/dL (ref 13.0–17.0)
Lymphocytes Relative: 43.4 % (ref 12.0–46.0)
Lymphs Abs: 2.1 10*3/uL (ref 0.7–4.0)
MCHC: 33.4 g/dL (ref 30.0–36.0)
MCV: 89.3 fl (ref 78.0–100.0)
Monocytes Absolute: 0.3 10*3/uL (ref 0.1–1.0)
Monocytes Relative: 5.5 % (ref 3.0–12.0)
Neutro Abs: 2.2 10*3/uL (ref 1.4–7.7)
Neutrophils Relative %: 45.4 % (ref 43.0–77.0)
Platelets: 139 10*3/uL — ABNORMAL LOW (ref 150.0–400.0)
RBC: 4.96 Mil/uL (ref 4.22–5.81)
RDW: 14.6 % (ref 11.5–15.5)
WBC: 4.8 10*3/uL (ref 4.0–10.5)

## 2018-10-24 LAB — COMPREHENSIVE METABOLIC PANEL
ALT: 84 U/L — ABNORMAL HIGH (ref 0–53)
AST: 41 U/L — ABNORMAL HIGH (ref 0–37)
Albumin: 4.4 g/dL (ref 3.5–5.2)
Alkaline Phosphatase: 47 U/L (ref 39–117)
BUN: 16 mg/dL (ref 6–23)
CO2: 27 mEq/L (ref 19–32)
Calcium: 9 mg/dL (ref 8.4–10.5)
Chloride: 105 mEq/L (ref 96–112)
Creatinine, Ser: 1.09 mg/dL (ref 0.40–1.50)
GFR: 75.1 mL/min (ref >60.00)
Glucose, Bld: 83 mg/dL (ref 70–99)
Potassium: 4.3 mEq/L (ref 3.5–5.1)
Sodium: 141 mEq/L (ref 135–145)
Total Bilirubin: 0.6 mg/dL (ref 0.2–1.2)
Total Protein: 6.8 g/dL (ref 6.0–8.3)

## 2018-10-24 LAB — T3, FREE: T3, Free: 3.5 pg/mL (ref 2.3–4.2)

## 2018-10-24 LAB — LIPID PANEL
Cholesterol: 186 mg/dL (ref 0–200)
HDL: 47 mg/dL (ref >39.00)
LDL Cholesterol: 110 mg/dL — ABNORMAL HIGH (ref 0–99)
NonHDL: 139.47
Total CHOL/HDL Ratio: 4
Triglycerides: 146 mg/dL (ref 0.0–149.0)
VLDL: 29.2 mg/dL (ref 0.0–40.0)

## 2018-10-24 LAB — T4, FREE: Free T4: 0.67 ng/dL (ref 0.60–1.60)

## 2018-10-24 LAB — TSH: TSH: 3.25 u[IU]/mL (ref 0.35–4.50)

## 2018-10-24 NOTE — Progress Notes (Signed)
Subjective:    Patient ID: Bradley James, male    DOB: 03-Oct-1980, 38 y.o.   MRN: [id number]  DOS:  10/24/2018 Type of visit - description: cpx In general feeling well. Reports weight gain, he has been less active, not practicing portion control.  Wt Readings from Last 3 Encounters:  10/24/18 242 lb (109.8 kg)  09/26/18 245 lb (111.1 kg)  10/19/17 230 lb (104.3 kg)     Review of Systems   Other than above, a 14 point review of systems is negative    Past Medical History:  Diagnosis Date  . Elevated LFTs     Past Surgical History:  Procedure Laterality Date  . NO PAST SURGERIES      Social History   Socioeconomic History  . Marital status: Married    Spouse name: Not on file  . Number of children: 2  . Years of education: Not on file  . Highest education level: Not on file  Occupational History  . Occupation: Glass blower/designer     Employer: [employer]  . Financial resource strain: Not on file  . Food insecurity    Worry: Not on file    Inability: Not on file  . Transportation needs    Medical: Not on file    Non-medical: Not on file  Tobacco Use  . Smoking status: Never Smoker  . Smokeless tobacco: Never Used  Substance and Sexual Activity  . Alcohol use: Yes    Comment: socially   . Drug use: No  . Sexual activity: Not on file  Lifestyle  . Physical activity    Days per week: Not on file    Minutes per session: Not on file  . Stress: Not on file  Relationships  . Social Herbalist on phone: Not on file    Gets together: Not on file    Attends religious service: Not on file    Active member of club or organization: Not on file    Attends meetings of clubs or organizations: Not on file    Relationship status: Not on file  . Intimate partner violence    Fear of current or ex partner: Not on file    Emotionally abused: Not on file    Physically abused: Not on file    Forced sexual activity: Not on file  Other Topics Concern   . Not on file  Social History Narrative   Original from [place], married    Children: Mathias        Family History  Problem Relation Age of Onset  . Cancer Paternal Grandfather        stomach, late in life  . Diabetes Maternal Grandfather   . Hypertension Other        aunt  . Hyperlipidemia Father        high TG  . CAD Father   . Prostate cancer Neg Hx   . Colon cancer Neg Hx     Allergies as of 10/24/2018   No Known Allergies     Medication List    as of October 24, 2018  5:03 PM   You have not been prescribed any medications.         Objective:   Physical Exam BP 119/68 (BP Location: Left Arm, Patient Position: Sitting, Cuff Size: Normal)   Pulse 79   Temp (!) 96.4 F (35.8 C) (Temporal)   Resp 18   Ht 5\' 10"  (1.778  m)   Wt 242 lb (109.8 kg)   SpO2 99%   BMI 34.72 kg/m  General: Well developed, NAD, BMI noted Neck: No  thyromegaly  HEENT:  Normocephalic . Face symmetric, atraumatic Lungs:  CTA B Normal respiratory effort, no intercostal retractions, no accessory muscle use. Heart: RRR,  no murmur.  No pretibial edema bilaterally  Abdomen:  Not distended, soft, non-tender. No rebound or rigidity.   Skin: Exposed areas without rash. Not pale. Not jaundice Neurologic:  alert & oriented X3.  Speech normal, gait appropriate for age and unassisted Strength symmetric and appropriate for age.  Psych: Cognition and judgment appear intact.  Cooperative with normal attention span and concentration.  Behavior appropriate. No anxious or depressed appearing.     Assessment     Assessment Large mole, left leg, h/o (-) Bxs before Increase LFTs: Hep B and C negative 08-2014. 08-2015: Normal ceruloplasmin, iron; [place]: Fatty liver  Plan: Here for CPX RTC 1 year

## 2018-10-24 NOTE — Assessment & Plan Note (Signed)
Here for CPX RTC 1 year    

## 2018-10-24 NOTE — Patient Instructions (Signed)
GO TO THE LAB : Get the blood work     GO TO THE FRONT DESK Schedule your next appointment   for a physical exam in 1 year 

## 2018-10-24 NOTE — Telephone Encounter (Signed)
Physical form received and completed and faxed to [employer] at [phone]. Form sent for scanning. Received fax confirmation.

## 2018-10-24 NOTE — Assessment & Plan Note (Addendum)
-  Td 2015, flu shot today -labs: cmp flp cbc tfts -Diet and exercise: Recommend control, increase physical activity to at least 2 - 3 hours a week.

## 2018-10-24 NOTE — Progress Notes (Signed)
Pre visit review using our clinic review tool, if applicable. No additional management support is needed unless otherwise documented below in the visit note. 

## 2019-09-16 ENCOUNTER — Other Ambulatory Visit: Payer: Self-pay | Admitting: Internal Medicine

## 2019-09-16 ENCOUNTER — Encounter: Payer: Self-pay | Admitting: Internal Medicine

## 2019-09-16 MED ORDER — GENTAMICIN SULFATE 0.3 % OP OINT: TOPICAL_OINTMENT | Freq: Three times a day (TID) | OPHTHALMIC | 0 refills | Status: DC

## 2019-09-16 MED ORDER — GENTAMICIN SULFATE 0.3 % OP SOLN: 2.0000 [drp] | OPHTHALMIC | 0 refills | Status: DC

## 2019-09-22 ENCOUNTER — Telehealth: Payer: Self-pay

## 2019-09-22 NOTE — Telephone Encounter (Signed)
Please ask the pharmacy for an alternative

## 2019-09-22 NOTE — Telephone Encounter (Signed)
Spoke w/ [facility]- Pt picked up solution on 09/16/2019. (Was ointment that was out of stock).

## 2019-09-22 NOTE — Telephone Encounter (Signed)
Gentamicin ophthalmic solution on back order. Can you send alternate please?

## 2019-10-30 ENCOUNTER — Encounter: Payer: BC Managed Care – PPO | Admitting: Internal Medicine

## 2019-12-09 ENCOUNTER — Encounter: Payer: Self-pay | Admitting: Internal Medicine

## 2019-12-09 DIAGNOSIS — R7989 Other specified abnormal findings of blood chemistry: Secondary | ICD-10-CM

## 2019-12-09 DIAGNOSIS — Z0000 Encounter for general adult medical examination without abnormal findings: Secondary | ICD-10-CM

## 2019-12-12 ENCOUNTER — Other Ambulatory Visit (INDEPENDENT_AMBULATORY_CARE_PROVIDER_SITE_OTHER): Payer: BC Managed Care – PPO

## 2019-12-12 ENCOUNTER — Other Ambulatory Visit: Payer: Self-pay

## 2019-12-12 ENCOUNTER — Telehealth: Payer: Self-pay | Admitting: Internal Medicine

## 2019-12-12 DIAGNOSIS — R7989 Other specified abnormal findings of blood chemistry: Secondary | ICD-10-CM | POA: Diagnosis not present

## 2019-12-12 DIAGNOSIS — Z0000 Encounter for general adult medical examination without abnormal findings: Secondary | ICD-10-CM | POA: Diagnosis not present

## 2019-12-12 DIAGNOSIS — E785 Hyperlipidemia, unspecified: Secondary | ICD-10-CM | POA: Diagnosis not present

## 2019-12-12 NOTE — Telephone Encounter (Signed)
Pt dropped off document to be filled out by providers (1 page [employer] health service) Pt would like to be called when document ready at [phone]. Documen put at front office tray under providers name.

## 2019-12-13 LAB — CBC WITH DIFFERENTIAL/PLATELET
Absolute Monocytes: 410 cells/uL (ref 200–950)
Basophils Absolute: 72 cells/uL (ref 0–200)
Basophils Relative: 1 %
Eosinophils Absolute: 331 cells/uL (ref 15–500)
Eosinophils Relative: 4.6 %
HCT: 46.5 % (ref 38.5–50.0)
Hemoglobin: 15.3 g/dL (ref 13.2–17.1)
Lymphs Abs: 2599 cells/uL (ref 850–3900)
MCH: 29.5 pg (ref 27.0–33.0)
MCHC: 32.9 g/dL (ref 32.0–36.0)
MCV: 89.8 fL (ref 80.0–100.0)
MPV: 10.9 fL (ref 7.5–12.5)
Monocytes Relative: 5.7 %
Neutro Abs: 3787 cells/uL (ref 1500–7800)
Neutrophils Relative %: 52.6 %
Platelets: 135 10*3/uL — ABNORMAL LOW (ref 140–400)
RBC: 5.18 10*6/uL (ref 4.20–5.80)
RDW: 13.7 % (ref 11.0–15.0)
Total Lymphocyte: 36.1 %
WBC: 7.2 10*3/uL (ref 3.8–10.8)

## 2019-12-13 LAB — LIPID PANEL
Cholesterol: 196 mg/dL (ref <200)
HDL: 50 mg/dL (ref >=40)
LDL Cholesterol (Calc): 121 mg/dL (calc) — ABNORMAL HIGH
Non-HDL Cholesterol (Calc): 146 mg/dL (calc) — ABNORMAL HIGH (ref <130)
Total CHOL/HDL Ratio: 3.9 (calc) (ref <5.0)
Triglycerides: 142 mg/dL (ref <150)

## 2019-12-13 LAB — COMPREHENSIVE METABOLIC PANEL
AG Ratio: 1.7 (calc) (ref 1.0–2.5)
ALT: 64 U/L — ABNORMAL HIGH (ref 9–46)
AST: 37 U/L (ref 10–40)
Albumin: 4.4 g/dL (ref 3.6–5.1)
Alkaline phosphatase (APISO): 52 U/L (ref 36–130)
BUN: 18 mg/dL (ref 7–25)
CO2: 25 mmol/L (ref 20–32)
Calcium: 9.4 mg/dL (ref 8.6–10.3)
Chloride: 103 mmol/L (ref 98–110)
Creat: 1.27 mg/dL (ref 0.60–1.35)
Globulin: 2.6 g/dL (calc) (ref 1.9–3.7)
Glucose, Bld: 84 mg/dL (ref 65–99)
Potassium: 4.6 mmol/L (ref 3.5–5.3)
Sodium: 141 mmol/L (ref 135–146)
Total Bilirubin: 0.7 mg/dL (ref 0.2–1.2)
Total Protein: 7 g/dL (ref 6.1–8.1)

## 2019-12-13 LAB — TSH: TSH: 5.89 mIU/L — ABNORMAL HIGH (ref 0.40–4.50)

## 2019-12-13 LAB — T4, FREE: Free T4: 0.9 ng/dL (ref 0.8–1.8)

## 2019-12-14 NOTE — Telephone Encounter (Signed)
Please complete the form and let him know. Weight 230 pounds, waist 43.

## 2019-12-15 NOTE — Telephone Encounter (Signed)
We can use the last vital signs set available in the chart for his blood pressure

## 2019-12-16 NOTE — Telephone Encounter (Signed)
LMOM informing Pt that form is completed and ready for pick up. Copy of form sent for scanning.

## 2019-12-16 NOTE — Telephone Encounter (Signed)
Done. Awaiting provider signature on form.

## 2019-12-23 ENCOUNTER — Telehealth: Payer: Self-pay

## 2019-12-23 ENCOUNTER — Ambulatory Visit (INDEPENDENT_AMBULATORY_CARE_PROVIDER_SITE_OTHER): Payer: BC Managed Care – PPO | Admitting: Internal Medicine

## 2019-12-23 ENCOUNTER — Other Ambulatory Visit: Payer: Self-pay

## 2019-12-23 ENCOUNTER — Encounter: Payer: Self-pay | Admitting: Internal Medicine

## 2019-12-23 VITALS — BP 113/72 | HR 74 | Temp 98.1°F | Resp 16 | Ht 70.0 in | Wt 241.0 lb

## 2019-12-23 DIAGNOSIS — R7989 Other specified abnormal findings of blood chemistry: Secondary | ICD-10-CM

## 2019-12-23 DIAGNOSIS — Z0000 Encounter for general adult medical examination without abnormal findings: Secondary | ICD-10-CM

## 2019-12-23 DIAGNOSIS — Z23 Encounter for immunization: Secondary | ICD-10-CM

## 2019-12-23 NOTE — Telephone Encounter (Signed)
No labs needed tomorrow that I know of. He has lab appt scheduled 03/2020 for repeat labs.

## 2019-12-23 NOTE — Patient Instructions (Signed)
MYFITNESSPAL   GO TO THE FRONT DESK, PLEASE SCHEDULE YOUR APPOINTMENTS Come back for blood work only in 4 months  Come back for a physical exam in 1 year

## 2019-12-23 NOTE — Telephone Encounter (Signed)
Cancelled.  

## 2019-12-23 NOTE — Telephone Encounter (Signed)
Can you call pt and explain to him that lab appt is not needed at this time and cancel the appointment for tomorrow?

## 2019-12-23 NOTE — Telephone Encounter (Signed)
Patient has lab appointment tomorrow future orders say tsh and free t4 expected date is 04/24/20 please verify what labs need to be done tomorrow and place orders if needed.  Thanks

## 2019-12-23 NOTE — Progress Notes (Signed)
Pre visit review using our clinic review tool, if applicable. No additional management support is needed unless otherwise documented below in the visit note. 

## 2019-12-23 NOTE — Progress Notes (Signed)
   Subjective:    Patient ID: Bradley James, male    DOB: 1980/09/04, 39 y.o.   MRN: [id number]  DOS:  12/23/2019 Type of visit - description: cpx Since the last office visit he is feeling well.  Has no concerns except his weight.  Wt Readings from Last 3 Encounters:  12/23/19 241 lb (109.3 kg)  10/24/18 242 lb (109.8 kg)  09/26/18 245 lb (111.1 kg)     Review of Systems    A 14 point review of systems is negative     Past Medical History:  Diagnosis Date  . Elevated LFTs     Past Surgical History:  Procedure Laterality Date  . NO PAST SURGERIES      Allergies as of 12/23/2019   No Known Allergies     Medication List       Accurate as of December 23, 2019 11:59 PM. If you have any questions, ask your nurse or doctor.        STOP taking these medications   gentamicin 0.3 % ophthalmic solution Commonly known as: GARAMYCIN Stopped by: Kathlene November, MD          Objective:   Physical Exam BP 113/72 (BP Location: Left Arm, Patient Position: Sitting, Cuff Size: Normal)   Pulse 74   Temp 98.1 F (36.7 C) (Oral)   Resp 16   Ht 5\' 10"  (1.778 m)   Wt 241 lb (109.3 kg)   SpO2 98%   BMI 34.58 kg/m  General: Well developed, NAD, BMI noted Neck: No  thyromegaly  HEENT:  Normocephalic . Face symmetric, atraumatic Lungs:  CTA B Normal respiratory effort, no intercostal retractions, no accessory muscle use. Heart: RRR,  no murmur.  Abdomen:  Not distended, soft, non-tender. No rebound or rigidity.   Lower extremities: no pretibial edema bilaterally  Skin: Exposed areas without rash. Not pale. Not jaundice Neurologic:  alert & oriented X3.  Speech normal, gait appropriate for age and unassisted Strength symmetric and appropriate for age.  Psych: Cognition and judgment appear intact.  Cooperative with normal attention span and concentration.  Behavior appropriate. No anxious or depressed appearing.     Assessment     Assessment Large mole, left leg, h/o  (-) Bxs before Increase LFTs: Hep B and C negative 08-2014. 08-2015: Normal ceruloplasmin, iron; [place]: Fatty liver Increased TSH   PLAN Here for CPX, labs done few days ago reviewed with the patient Increase LFTs: Slightly increased LFTs, not far from baseline. Increased TSH: Last TSH 5.8, recheck in 4 months, consider treatment at some point. RTC 1 year   This visit occurred during the SARS-CoV-2 public health emergency.  Safety protocols were in place, including screening questions prior to the visit, additional usage of staff PPE, and extensive cleaning of exam room while observing appropriate contact time as indicated for disinfecting solutions.

## 2019-12-24 ENCOUNTER — Other Ambulatory Visit: Payer: BC Managed Care – PPO

## 2019-12-25 ENCOUNTER — Encounter: Payer: Self-pay | Admitting: Internal Medicine

## 2019-12-25 NOTE — Assessment & Plan Note (Signed)
-  Td 2015. -Had COVID vaccination x2, booster is optional but I am not opposed due to his BMI.  -Flu shot today -labs: Reviewed -Diet and exercise:Extensive discussion, recommend increase physical activity and eat healthy.  We also talk about calorie counting and my fitnesspal

## 2019-12-25 NOTE — Assessment & Plan Note (Signed)
Here for CPX, labs done few days ago reviewed with the patient Increase LFTs: Slightly increased LFTs, not far from baseline. Increased TSH: Last TSH 5.8, recheck in 4 months, consider treatment at some point. RTC 1 year

## 2020-01-10 ENCOUNTER — Emergency Department (HOSPITAL_BASED_OUTPATIENT_CLINIC_OR_DEPARTMENT_OTHER)
Admission: EM | Admit: 2020-01-10 | Discharge: 2020-01-10 | Disposition: A | Discharge status: Home / Self Care | Payer: BC Managed Care – PPO | Attending: Emergency Medicine | Admitting: Emergency Medicine

## 2020-01-10 ENCOUNTER — Emergency Department (HOSPITAL_BASED_OUTPATIENT_CLINIC_OR_DEPARTMENT_OTHER): Payer: BC Managed Care – PPO

## 2020-01-10 ENCOUNTER — Other Ambulatory Visit: Payer: Self-pay

## 2020-01-10 ENCOUNTER — Encounter (HOSPITAL_BASED_OUTPATIENT_CLINIC_OR_DEPARTMENT_OTHER): Payer: Self-pay | Admitting: Emergency Medicine

## 2020-01-10 DIAGNOSIS — R109 Unspecified abdominal pain: Secondary | ICD-10-CM | POA: Diagnosis not present

## 2020-01-10 DIAGNOSIS — M549 Dorsalgia, unspecified: Secondary | ICD-10-CM | POA: Diagnosis not present

## 2020-01-10 DIAGNOSIS — M545 Low back pain, unspecified: Secondary | ICD-10-CM | POA: Insufficient documentation

## 2020-01-10 DIAGNOSIS — M5440 Lumbago with sciatica, unspecified side: Secondary | ICD-10-CM | POA: Diagnosis not present

## 2020-01-10 HISTORY — DX: Dorsalgia, unspecified: M54.9

## 2020-01-10 MED ORDER — DICLOFENAC SODIUM 1 % EX GEL: 2.0000 g | Freq: Four times a day (QID) | CUTANEOUS | 0 refills | Status: DC | PRN

## 2020-01-10 MED ORDER — METHOCARBAMOL 500 MG PO TABS: 500.0000 mg | ORAL_TABLET | Freq: Three times a day (TID) | ORAL | 0 refills | Status: DC | PRN

## 2020-01-10 MED ORDER — DEXAMETHASONE SODIUM PHOSPHATE 10 MG/ML IJ SOLN
10.0000 mg | Freq: Once | INTRAMUSCULAR | Status: AC
  Administered 2020-01-10: 10 mg via INTRAMUSCULAR
  Filled 2020-01-10: qty 1

## 2020-01-10 MED ORDER — IBUPROFEN 800 MG PO TABS: 800.0000 mg | ORAL_TABLET | Freq: Three times a day (TID) | ORAL | 0 refills | Status: DC | PRN

## 2020-01-10 MED ORDER — KETOROLAC TROMETHAMINE 60 MG/2ML IM SOLN
60.0000 mg | Freq: Once | INTRAMUSCULAR | Status: AC
  Administered 2020-01-10: 60 mg via INTRAMUSCULAR
  Filled 2020-01-10: qty 2

## 2020-01-10 MED ORDER — METHOCARBAMOL 500 MG PO TABS
500.0000 mg | ORAL_TABLET | Freq: Once | ORAL | Status: AC
  Administered 2020-01-10: 500 mg via ORAL
  Filled 2020-01-10: qty 1

## 2020-01-10 MED ORDER — DIAZEPAM 5 MG PO TABS
5.0000 mg | ORAL_TABLET | Freq: Once | ORAL | Status: AC
  Administered 2020-01-10: 5 mg via ORAL
  Filled 2020-01-10: qty 1

## 2020-01-10 NOTE — ED Notes (Signed)
Assisted to POV, ambulated with RN at side, assisted into POV, copy of AVS and work note given to wife

## 2020-01-10 NOTE — ED Notes (Signed)
Pt having back spasms when standing, became pale, EDP informed, medicated with PO valium, assisted pt to chair, heat packs applied

## 2020-01-10 NOTE — ED Notes (Signed)
Arrived via EMS secondary to acute back pain, had bent over to put on shoes, then had sudden sharp pain was felt at lower back, rt and lt areas. Currently when lying still has 0 pain on 0-10 scale. Denies any previous injury or trauma to back area.

## 2020-01-10 NOTE — ED Notes (Signed)
Denies any numbness or tingling in lower extremities, has strong plantar and dorsal flexion bilaterly

## 2020-01-10 NOTE — ED Notes (Signed)
Patient transported to CT 

## 2020-01-10 NOTE — ED Provider Notes (Signed)
Emergency Department Provider Note   I have reviewed the triage vital signs and the nursing notes.   HISTORY  Chief Complaint Back Pain   HPI Bradley James is a 39 y.o. male with past medical history reviewed below presents to the emergency department with lower back and flank pain worsening suddenly this morning.  Patient is having pain around the lower back without radiation into the legs.  He is not having numbness or weakness in the lower extremities.  No groin numbness.  No bowel or bladder incontinence.  No urinary retention symptoms.  Pain started while he was pulling on his shoes early this morning.  He took over-the-counter pain medication as well as icy hot without relief.  He is not having significant pain when at rest but when he gets up to move his pain is more severe.  He felt he could not get in the car under his own power and so ultimately called EMS.  No fevers. No IVDA.    Past Medical History:  Diagnosis Date  . Back pain   . Elevated LFTs     Patient Active Problem List   Diagnosis Date Noted  . PCP NOTES >>>>>>>>>>>>>>>>>>>>>>>. 09/26/2015  . Elevated LFTs 09/02/2014  . Lumbar strain 12/03/2012  . Annual physical exam 06/26/2011  . Benign neoplasm of skin 12/24/2006    Past Surgical History:  Procedure Laterality Date  . NO PAST SURGERIES      Allergies Patient has no known allergies.  Family History  Problem Relation Age of Onset  . Cancer Paternal Grandfather        stomach, late in life  . Diabetes Maternal Grandfather   . Hypertension Other        aunt  . Hyperlipidemia Father        high TG  . CAD Father 42  . Prostate cancer Neg Hx   . Colon cancer Neg Hx     Social History Social History   Tobacco Use  . Smoking status: Never Smoker  . Smokeless tobacco: Never Used  Substance Use Topics  . Alcohol use: Yes    Comment: socially   . Drug use: No    Review of Systems  Constitutional: No fever/chills Eyes: No visual  changes. ENT: No sore throat. Cardiovascular: Denies chest pain. Respiratory: Denies shortness of breath. Gastrointestinal: No abdominal pain.  No nausea, no vomiting.  No diarrhea.  No constipation. Genitourinary: Negative for dysuria. Musculoskeletal: Positive for back pain. Skin: Negative for rash. Neurological: Negative for headaches, focal weakness or numbness.  10-point ROS otherwise negative.  ____________________________________________   PHYSICAL EXAM:  VITAL SIGNS: ED Triage Vitals  Enc Vitals Group     BP 01/10/20 1709 113/69     Pulse Rate 01/10/20 1709 78     Resp 01/10/20 1709 18     Temp 01/10/20 1709 98.1 F (36.7 C)     Temp Source 01/10/20 1709 Oral     SpO2 01/10/20 1709 98 %     Weight 01/10/20 1711 230 lb (104.3 kg)     Height 01/10/20 1711 5\' 10"  (1.778 m)   Constitutional: Alert and oriented. Well appearing and in no acute distress. Eyes: Conjunctivae are normal.  Head: Atraumatic. Nose: No congestion/rhinnorhea. Mouth/Throat: Mucous membranes are moist.  Neck: No stridor.   Cardiovascular: Normal rate, regular rhythm. Good peripheral circulation. Grossly normal heart sounds.   Respiratory: Normal respiratory effort.  No retractions. Lungs CTAB. Gastrointestinal: Soft and nontender. No distention. Bilateral  CVA tenderness.  Musculoskeletal: No gross deformities of extremities. Neurologic:  Normal speech and language.  Normal strength and sensation of the bilateral lower extremities.  2+ patellar reflexes bilaterally.  Skin:  Skin is warm, dry and intact. No rash noted.  ____________________________________________  RADIOLOGY  CT Renal Stone Study  Result Date: 01/10/2020 CLINICAL DATA:  Back pain EXAM: CT ABDOMEN AND PELVIS WITHOUT CONTRAST TECHNIQUE: Multidetector CT imaging of the abdomen and pelvis was performed following the standard protocol without IV contrast. COMPARISON:  None. FINDINGS: Lower chest: No acute abnormality. Hepatobiliary:  Unremarkable noncontrast appearance of the liver. Gallbladder is unremarkable. Pancreas: No peripancreatic fat stranding. Spleen: Spleen is unremarkable. Adrenals/Urinary Tract: Adrenal glands are unremarkable. No nephrolithiasis. No hydronephrosis. Bladder is unremarkable. Stomach/Bowel: Stomach is within normal limits. Appendix appears normal. No evidence of bowel wall thickening, distention, or inflammatory changes. Vascular/Lymphatic: No significant vascular findings are present. No enlarged abdominal or pelvic lymph nodes. Reproductive: Prostate is unremarkable. Other: No abdominal wall hernia or abnormality. No abdominopelvic ascites. Musculoskeletal: Bilateral pars defects at L3, age indeterminate but favored chronic. IMPRESSION: 1. No acute intra-abdominal process. 2. Bilateral pars defects at L3, age indeterminate but favored chronic. Electronically Signed   By: Valentino Saxon MD   On: 01/10/2020 18:19    ____________________________________________   PROCEDURES  Procedure(s) performed:   Procedures  None  ____________________________________________   INITIAL IMPRESSION / ASSESSMENT AND PLAN / ED COURSE  Pertinent labs & imaging results that were available during my care of the patient were reviewed by me and considered in my medical decision making (see chart for details).   Patient presents to the ED for evaluation of lower back pain.  Patient with bilateral flank type pain.  No neurologic findings on exam to suspect cauda equina, epidural abscess, other neurosurgical emergency.  Likely radicular type pain but not radiating into the legs with sciatica.  Plan for CT renal to evaluate for possible ureteral stone and will treat symptoms here.   CT renal with no acute findings. Patient improved here with Valium, Toradol, and Robaxin. He is up and ambulatory here. Pain quality consistent with lumbar spasm. Discussed early mobility and close PCP follow up with spine referral from  there if symptoms do not respond or worsen.  ____________________________________________  FINAL CLINICAL IMPRESSION(S) / ED DIAGNOSES  Final diagnoses:  Acute bilateral low back pain without sciatica     MEDICATIONS GIVEN DURING THIS VISIT:  Medications  ketorolac (TORADOL) injection 60 mg (60 mg Intramuscular Given 01/10/20 1751)  methocarbamol (ROBAXIN) tablet 500 mg (500 mg Oral Given 01/10/20 1750)  dexamethasone (DECADRON) injection 10 mg (10 mg Intramuscular Given 01/10/20 1908)  diazepam (VALIUM) tablet 5 mg (5 mg Oral Given 01/10/20 1916)     NEW OUTPATIENT MEDICATIONS STARTED DURING THIS VISIT:  Discharge Medication List as of 01/10/2020  6:56 PM    START taking these medications   Details  diclofenac Sodium (VOLTAREN) 1 % GEL Apply 2 g topically 4 (four) times daily as needed (muscle pain)., Starting Sat 01/10/2020, Normal    ibuprofen (ADVIL) 800 MG tablet Take 1 tablet (800 mg total) by mouth every 8 (eight) hours as needed for moderate pain., Starting Sat 01/10/2020, Normal    methocarbamol (ROBAXIN) 500 MG tablet Take 1 tablet (500 mg total) by mouth every 8 (eight) hours as needed for muscle spasms., Starting Sat 01/10/2020, Normal        Note:  This document was prepared using Dragon voice recognition software and may include unintentional  dictation errors.  Nanda Quinton, MD, [facility]    Dameir Gentzler, Wonda Olds, MD 01/10/20 2251

## 2020-01-10 NOTE — ED Triage Notes (Signed)
Arrived via [facility], with back pain. Upon arrival was laying on floor, unable to get up after bending over to put on shoes. Prior back pain x 3 weeks ago doing the same. BP 142/72, HR 80. R18, O2sat 98%RA, T 98.7

## 2020-01-10 NOTE — Discharge Instructions (Signed)
You have been seen in the Emergency Department (ED)  today for back pain.  Your workup and exam have not shown any acute abnormalities and you are likely suffering from muscle strain or possible problems with your discs, but there is no treatment that will fix your symptoms at this time.  Please take Motrin (ibuprofen) as needed for your pain according to the instructions.  Please follow up with your doctor as soon as possible regarding today's ED visit and your back pain.  Return to the ED for worsening back pain, fever, weakness or numbness of either leg, or if you develop either (1) an inability to urinate or have bowel movements, or (2) loss of your ability to control your bathroom functions (if you start having "accidents"), or if you develop other new symptoms that concern you.

## 2020-01-10 NOTE — ED Notes (Signed)
Positioned for comfort, sr x 2 up, call bell within reach, wife at bedside

## 2020-01-12 ENCOUNTER — Other Ambulatory Visit: Payer: Self-pay

## 2020-01-12 ENCOUNTER — Encounter: Payer: Self-pay | Admitting: Internal Medicine

## 2020-01-12 ENCOUNTER — Ambulatory Visit (INDEPENDENT_AMBULATORY_CARE_PROVIDER_SITE_OTHER): Payer: BC Managed Care – PPO | Admitting: Internal Medicine

## 2020-01-12 VITALS — BP 127/73 | HR 66 | Temp 98.2°F | Resp 18 | Ht 70.0 in | Wt 246.5 lb

## 2020-01-12 DIAGNOSIS — M545 Low back pain, unspecified: Secondary | ICD-10-CM

## 2020-01-12 MED ORDER — PREDNISONE 10 MG PO TABS: ORAL_TABLET | ORAL | 0 refills | Status: DC

## 2020-01-12 NOTE — Patient Instructions (Signed)
Take methocarbamol (a muscle relaxant) at bedtime  Take prednisone as prescribed for few days  For pain try Tylenol  500 mg OTC 2 tabs a day every 8 hours as needed for pain  If the pain continues take the ibuprofen you were prescribed the emergency room, twice a day as needed. Always take it with food because may cause gastritis and ulcers.  If you notice nausea, stomach pain, change in the color of stools --->  Stop the medicine and let us know   Warm compress  Stretching  You can get good distraction techniques at the [facility] website  Call if not gradually better       Ejercicios para la espalda Back Exercises Los siguientes ejercicios fortalecen los msculos que dan soporte al tronco y a la espalda. Adems, ayudan a mantener la flexibilidad de la zona lumbar. Hacer estos ejercicios puede ser de ayuda para evitar o Best boy de espalda.  Si tiene dolor o [employer] espalda, intente hacer estos ejercicios 2 o 3veces por da, o como se lo haya indicado el mdico.  A medida que el dolor desaparece, hgalos una vez por da, pero aumente la cantidad de veces que repite los pasos para cada ejercicio (haga ms repeticiones).  Para prevenir la recurrencia del dolor de espalda, contine haciendo estos ejercicios una vez al da o como se lo haya indicado el mdico. Haga los ejercicios exactamente como se lo haya indicado el mdico y gradelos como se lo hayan indicado. Es normal sentir un leve estiramiento, tirn, rigidez o molestia cuando haga estos ejercicios, pero debe detenerse de inmediato si siente un dolor repentino o si el dolor empeora. Ejercicios Rodilla al pecho Repita estos pasos 3 o 5veces con cada pierna: 1. Acustese boca arriba sobre una cama dura o sobre el suelo con las piernas extendidas. 2. Lleve una rodilla al pecho. La otra pierna debe quedar extendida y en contacto con el suelo. 3. [place]. Para lograrlo tmese la  rodilla o el muslo con ambas manos y sostenga. 4. Tire de la rodilla hasta sentir una elongacin suave en la parte baja de la espalda o las nalgas. 5. Mantenga la elongacin durante 10 a 30segundos. 6. Suelte y extienda la pierna lentamente. Inclinacin de la pelvis Repita estos pasos 5 o 10veces: 1. Acustese boca arriba sobre una cama dura o sobre el suelo con las piernas extendidas. 2. Flexione las rodillas de modo que apunten al techo y los pies queden apoyados en el suelo. 3. Contraiga los msculos de la parte baja del abdomen para empujar la zona lumbar contra el suelo. Con este movimiento se inclinar la pelvis de modo que el coxis apunte hacia el techo, en lugar de apuntar a los pies o al suelo. 4. Contraiga suavemente y respire con normalidad mientras mantiene esta posicin durante 5 a 10segundos. El perro y el gato Repita estos pasos hasta que la zona lumbar se vuelva ms flexible: 1. [place] manos y las rodillas sobre una superficie firme. Las manos deben estar alineadas con los hombros y las rodillas con las caderas. Puede colocarse almohadillas debajo de las rodillas para estar cmodo. 2. Deje que la cabeza cuelgue hacia el pecho. Contraiga los msculos abdominales y baje el coxis en direccin al suelo de modo que la zona lumbar se arquee como el lomo de un [place]. 3. Mantenga esta posicin durante 5segundos. 4. Lentamente, levante la cabeza, relaje los msculos  abdominales y eleve el coxis de modo que apunte en direccin al techo para que la espalda forme un arco hundido como el lomo de un perro contento. 5. Mantenga esta posicin durante 5segundos.  Flexiones de brazos Repita estos pasos 5 o 10veces: 1. Acustese sobre el abdomen (boca abajo) en el piso. [place] manos cerca de la cabeza, separadas aproximadamente al ancho de los hombros. 3. Con la espalda lo ms relajada posible y las caderas apoyadas en el suelo, extienda  lentamente los brazos para levantar la mitad superior del cuerpo y Community education officer los hombros. No use los msculos de la espalda para elevar la parte superior del torso. Puede cambiar las manos de lugar para estar ms cmodo. 4. Mantenga esta posicin durante 5segundos mientras mantiene la espalda relajada. 5. Lentamente vuelva a la posicin horizontal.  Puentes Repita estos pasos 10veces: 1. Acustese boca arriba sobre una superficie firme. 2. Flexione las rodillas de modo que apunten al techo y los pies queden apoyados en el suelo. Los brazos deben estar planos a los costados del cuerpo, junto al cuerpo. 3. Contraiga los glteos y despegue las nalgas del suelo hasta que la cintura est casi a la misma altura que las rodillas. Debe sentir el trabajo muscular en las nalgas y la parte de atrs de los muslos. Si no siente el esfuerzo de [employer], aleje los pies 1 o 2pulgadas (2.5 o 5centmetros) de las nalgas. 4. Mantenga esta posicin durante 3 a 5segundos. 5. Baje lentamente las caderas a la posicin inicial y relaje los glteos por completo. Si este ejercicio le resulta muy fcil, intente realizarlo con los brazos cruzados [place]. Abdominales Repita estos pasos 5 o 10veces: 1. Acustese boca arriba sobre una cama dura o sobre el suelo con las piernas extendidas. 2. Flexione las rodillas de modo que apunten al techo y los pies queden apoyados en el suelo. 3. Cruce los [employer]. 4. Baje levemente el mentn en direccin al pecho sin doblar el cuello. 5. Contraiga los msculos abdominales y con lentitud eleve el tronco (torso) lo suficiente como para Administrator los omplatos del suelo. No eleve el torso ms que eso, porque esto puede sobreexigir a la zona lumbar y no ayuda a Aeronautical engineer abdominales. 6. Regrese lentamente a la posicin inicial. Elevaciones de espalda Repita estos pasos 5 o 10veces: 1. Acustese sobre el abdomen (boca abajo) con los  brazos a los costados del cuerpo y apoye la frente en el suelo. 2. Contraiga los msculos de las piernas y las nalgas. 3. Lentamente despegue el pecho del suelo [employer] las caderas bien apoyadas en el suelo. Mantenga la nuca alineada con la curvatura de la espalda. Los ojos deben mirar al suelo. 4. Mantenga esta posicin durante 3 a 5segundos. 5. Regrese lentamente a la posicin inicial. Comunquese con un mdico si:  El dolor o las molestias en la espalda se vuelven mucho ms intensos cuando hace un ejercicio.  El dolor o las molestias en la espalda que [place], no se Runner, broadcasting/film/video en el trmino de las 2horas posteriores a Therapist, art. Si tiene alguno de [employer], deje de [employer] ejercicios de inmediato. No vuelva a hacer los ejercicios a menos que el mdico lo autorice. Solicite ayuda inmediatamente si:  Siente un dolor sbito e intenso en la espalda. Si esto ocurre, deje de [employer] ejercicios de inmediato. No vuelva a hacer los ejercicios a menos que el mdico lo  autorice. Esta informacin no tiene Marine scientist el consejo del mdico. Asegrese de hacerle al mdico cualquier pregunta que tenga. Document Revised: 12/27/2017 Document Reviewed: 12/27/2017 Elsevier Patient Education  2020 [employer].

## 2020-01-12 NOTE — Progress Notes (Signed)
Subjective:    Patient ID: Bradley James, male    DOB: 10/11/1980, 39 y.o.   MRN: [id number]  DOS:  01/12/2020 Type of visit - description: ER follow-up Went to the ER 2 days ago. He develop back pain while he was bending  forward trying a new pair of shoes. Pain was intense, and the low back and bilateral. There was no radiation. No fever, chills or rash. No lower extremity numbness or weakness. Denies abdominal pain or blood in the stools. The pain was so intense that he could not actually move much or walk. Eventually the wife called 911 and he was seen at the emergency room. At the ER, CT abdomen showed no urolithiasis, he was treated with prednisone, ibuprofen, and muscle relaxant. Since then he has improved gradually.   Review of Systems See above   Past Medical History:  Diagnosis Date  . Back pain   . Elevated LFTs     Past Surgical History:  Procedure Laterality Date  . NO PAST SURGERIES      Allergies as of 01/12/2020   No Known Allergies     Medication List       Accurate as of January 12, 2020  4:15 PM. If you have any questions, ask your nurse or doctor.        diclofenac Sodium 1 % Gel Commonly known as: Voltaren Apply 2 g topically 4 (four) times daily as needed (muscle pain).   ibuprofen 800 MG tablet Commonly known as: ADVIL Take 1 tablet (800 mg total) by mouth every 8 (eight) hours as needed for moderate pain.   methocarbamol 500 MG tablet Commonly known as: ROBAXIN Take 1 tablet (500 mg total) by mouth every 8 (eight) hours as needed for muscle spasms.          Objective:   Physical Exam BP 127/73 (BP Location: Right Arm, Patient Position: Sitting, Cuff Size: Normal)   Pulse 66   Temp 98.2 F (36.8 C) (Oral)   Resp 18   Ht 5\' 10"  (1.778 m)   Wt 246 lb 8 oz (111.8 kg)   SpO2 99%   BMI 35.37 kg/m  General:   Well developed, NAD, BMI noted.  HEENT:  Normocephalic . Face symmetric, atraumatic MSK: No TTP at the lumbar  spine. Abdomen:  Not distended, soft, non-tender. No rebound or rigidity.   Skin: Not pale. Not jaundice Lower extremities: no pretibial edema bilaterally  Neurologic:  alert & oriented X3.  Speech normal, gait and posture: Antalgic Straight leg test normal. DTRs symmetric, motor symmetric Psych--  Cognition and judgment appear intact.  Cooperative with normal attention span and concentration.  Behavior appropriate. No anxious or depressed appearing.     Assessment     Assessment Large mole, left leg, h/o (-) Bxs before Increase LFTs: Hep B and C negative 08-2014. 08-2015: Normal ceruloplasmin, iron; [place]: Fatty liver Increased TSH   PLAN Lumbalgia: Acute lumbalgia as described above, had similar episode last month, not as intense as self resolved.  Both times associated with bending his torso. Neurological exam is normal today. At the ER he received Toradol and shot of dexamethasone. Plan: Warm compress, round of prednisone, continue methocarbamol only at night. Pain management with Tylenol primarily and ibuprofen if the pain continue (with GI precautions). Offered a PT referral for stretching but he thinks is going to be very difficult to keep those appointments. Information about self PT provided, encourage to look into the [facility] website.  Definitely call if not improving. Work note if needed.   This visit occurred during the SARS-CoV-2 public health emergency.  Safety protocols were in place, including screening questions prior to the visit, additional usage of staff PPE, and extensive cleaning of exam room while observing appropriate contact time as indicated for disinfecting solutions.

## 2020-01-12 NOTE — Progress Notes (Signed)
Pre visit review using our clinic review tool, if applicable. No additional management support is needed unless otherwise documented below in the visit note. 

## 2020-01-14 NOTE — Assessment & Plan Note (Signed)
Lumbalgia: Acute lumbalgia as described above, had similar episode last month, not as intense as self resolved.  Both times associated with bending his torso. Neurological exam is normal today. At the ER he received Toradol and shot of dexamethasone. Plan: Warm compress, round of prednisone, continue methocarbamol only at night. Pain management with Tylenol primarily and ibuprofen if the pain continue (with GI precautions). Offered a PT referral for stretching but he thinks is going to be very difficult to keep those appointments. Information about self PT provided, encourage to look into the [facility] website. Definitely call if not improving. Work note if needed.

## 2020-04-26 ENCOUNTER — Other Ambulatory Visit: Payer: BC Managed Care – PPO

## 2020-04-26 ENCOUNTER — Encounter: Payer: Self-pay | Admitting: Internal Medicine

## 2020-08-05 ENCOUNTER — Encounter: Payer: Self-pay | Admitting: Internal Medicine

## 2020-08-05 ENCOUNTER — Other Ambulatory Visit: Payer: Self-pay | Admitting: Internal Medicine

## 2020-08-05 MED ORDER — GENTAMICIN SULFATE 0.3 % OP SOLN: 2.0000 [drp] | OPHTHALMIC | 0 refills | Status: DC

## 2020-09-29 ENCOUNTER — Encounter: Payer: Self-pay | Admitting: Internal Medicine

## 2020-09-29 ENCOUNTER — Ambulatory Visit (INDEPENDENT_AMBULATORY_CARE_PROVIDER_SITE_OTHER): Payer: BC Managed Care – PPO | Admitting: Internal Medicine

## 2020-09-29 ENCOUNTER — Other Ambulatory Visit: Payer: Self-pay

## 2020-09-29 VITALS — BP 116/76 | HR 89 | Temp 98.6°F | Resp 18 | Ht 70.0 in | Wt 240.4 lb

## 2020-09-29 DIAGNOSIS — J40 Bronchitis, not specified as acute or chronic: Secondary | ICD-10-CM | POA: Diagnosis not present

## 2020-09-29 DIAGNOSIS — J069 Acute upper respiratory infection, unspecified: Secondary | ICD-10-CM

## 2020-09-29 LAB — POCT INFLUENZA A/B
Influenza A, POC: NEGATIVE
Influenza B, POC: NEGATIVE

## 2020-09-29 MED ORDER — HYDROCODONE BIT-HOMATROP MBR 5-1.5 MG/5ML PO SOLN: 5.0000 mL | Freq: Every evening | ORAL | 0 refills | Status: DC | PRN

## 2020-09-29 MED ORDER — AZITHROMYCIN 250 MG PO TABS: ORAL_TABLET | ORAL | 0 refills | Status: DC

## 2020-09-29 NOTE — Progress Notes (Signed)
Subjective:    Patient ID: Bradley James, male    DOB: 31-Jan-1981, 40 y.o.   MRN: [id number]  DOS:  09/29/2020 Type of visit - description: Acute  Symptoms a started 2 days ago with dry cough, worse at night, has not been able to sleep. Today for the first time he brought up some sputum. + mild nasal discharge. He tested negative for COVID yesterday and today.  His wife has been sick, she tested negative for COVID and her symptoms are mild. His 2 children had a cold but they are better now.   Wt Readings from Last 3 Encounters:  09/29/20 240 lb 6 oz (109 kg)  01/12/20 246 lb 8 oz (111.8 kg)  01/10/20 230 lb (104.3 kg)     Review of Systems Denies any fever chills No chest pain or difficulty breathing No wheezing.  Past Medical History:  Diagnosis Date   Back pain    Elevated LFTs     Past Surgical History:  Procedure Laterality Date   NO PAST SURGERIES      Allergies as of 09/29/2020   No Known Allergies      Medication List        Accurate as of September 29, 2020 11:59 PM. If you have any questions, ask your nurse or doctor.          STOP taking these medications    diclofenac Sodium 1 % Gel Commonly known as: Voltaren Stopped by: Kathlene November, MD   gentamicin 0.3 % ophthalmic solution Commonly known as: GARAMYCIN Stopped by: Kathlene November, MD   ibuprofen 800 MG tablet Commonly known as: ADVIL Stopped by: Kathlene November, MD   methocarbamol 500 MG tablet Commonly known as: ROBAXIN Stopped by: Kathlene November, MD   predniSONE 10 MG tablet Commonly known as: DELTASONE Stopped by: Kathlene November, MD       TAKE these medications    azithromycin 250 MG tablet Commonly known as: Zithromax Z-Pak 2 tabs a day the first day, then 1 tab a day x 4 days Started by: Kathlene November, MD   HYDROcodone bit-homatropine 5-1.5 MG/5ML syrup Commonly known as: HYCODAN Take 5 mLs by mouth at bedtime as needed for cough. Started by: Kathlene November, MD           Objective:   Physical  Exam BP 116/76 (BP Location: Left Arm, Patient Position: Sitting, Cuff Size: Normal)   Pulse 89   Temp 98.6 F (37 C) (Oral)   Resp 18   Ht '5\' 10"'$  (1.778 m)   Wt 240 lb 6 oz (109 kg)   SpO2 98%   BMI 34.49 kg/m  General:   Well developed, NAD, BMI noted. HEENT:  Normocephalic . Face symmetric, atraumatic.  TMs are slightly bulge. Throat: Symmetric, no red. Nose with minimal congestion Lungs:  Few rhonchi with cough.  No crackles, no wheezing Normal respiratory effort, no intercostal retractions, no accessory muscle use. Heart: RRR,  no murmur.  Lower extremities: no pretibial edema bilaterally  Skin: Not pale. Not jaundice Neurologic:  alert & oriented X3.  Speech normal, gait appropriate for age and unassisted Psych--  Cognition and judgment appear intact.  Cooperative with normal attention span and concentration.  Behavior appropriate. No anxious or depressed appearing.      Assessment     Assessment Large mole, left leg, h/o (-) Bxs before Increase LFTs: Hep B and C negative 08-2014. 08-2015: Normal ceruloplasmin, iron; [place]: Fatty liver Increased TSH   PLAN  Bronchitis: Symptoms as described above, COVID test negative yesterday and today.  He is in no distress. Flu test today negative. Recommend the following: Recheck for COVID tomorrow 1 more time, let me know if positive. Otherwise treat with Zithromax, Mucinex, Tylenol.  Also sent hydrocodone as he is unable to sleep at night. He verbalized understanding.     This visit occurred during the SARS-CoV-2 public health emergency.  Safety protocols were in place, including screening questions prior to the visit, additional usage of staff PPE, and extensive cleaning of exam room while observing appropriate contact time as indicated for disinfecting solutions.

## 2020-09-29 NOTE — Patient Instructions (Signed)
Start taking the antibiotic called Zithromax  For cough: Over-the-counter Mucinex DM or Robitussin-DM until better For nighttime, use hydrocodone, will make you sleepy.  Tylenol as needed  Check for COVID one more time tomorrow. If it is positive let me know  If you are not gradually better in the next few days please call the office.

## 2020-09-30 ENCOUNTER — Ambulatory Visit: Payer: BC Managed Care – PPO | Admitting: Internal Medicine

## 2020-09-30 NOTE — Assessment & Plan Note (Addendum)
Bronchitis: Symptoms as described above, COVID test negative yesterday and today.  He is in no distress. Flu test today negative. Recommend the following: Recheck for COVID tomorrow 1 more time, let me know if positive. Otherwise treat with Zithromax, Mucinex, Tylenol.  Also sent hydrocodone as he is unable to sleep at night. He verbalized understanding.

## 2020-10-05 ENCOUNTER — Other Ambulatory Visit: Payer: BC Managed Care – PPO

## 2020-12-23 ENCOUNTER — Encounter: Payer: Self-pay | Admitting: Internal Medicine

## 2020-12-23 ENCOUNTER — Other Ambulatory Visit: Payer: Self-pay

## 2020-12-23 ENCOUNTER — Ambulatory Visit (INDEPENDENT_AMBULATORY_CARE_PROVIDER_SITE_OTHER): Payer: BC Managed Care – PPO | Admitting: Internal Medicine

## 2020-12-23 VITALS — BP 116/86 | HR 64 | Temp 98.1°F | Resp 16 | Ht 70.0 in | Wt 238.5 lb

## 2020-12-23 DIAGNOSIS — Z23 Encounter for immunization: Secondary | ICD-10-CM | POA: Diagnosis not present

## 2020-12-23 DIAGNOSIS — E039 Hypothyroidism, unspecified: Secondary | ICD-10-CM | POA: Diagnosis not present

## 2020-12-23 DIAGNOSIS — Z0000 Encounter for general adult medical examination without abnormal findings: Secondary | ICD-10-CM | POA: Diagnosis not present

## 2020-12-23 DIAGNOSIS — R7989 Other specified abnormal findings of blood chemistry: Secondary | ICD-10-CM | POA: Diagnosis not present

## 2020-12-23 LAB — COMPREHENSIVE METABOLIC PANEL
ALT: 94 U/L — ABNORMAL HIGH (ref 0–53)
AST: 58 U/L — ABNORMAL HIGH (ref 0–37)
Albumin: 4.4 g/dL (ref 3.5–5.2)
Alkaline Phosphatase: 46 U/L (ref 39–117)
BUN: 16 mg/dL (ref 6–23)
CO2: 28 mEq/L (ref 19–32)
Calcium: 8.7 mg/dL (ref 8.4–10.5)
Chloride: 102 mEq/L (ref 96–112)
Creatinine, Ser: 1.11 mg/dL (ref 0.40–1.50)
GFR: 82.36 mL/min (ref >60.00)
Glucose, Bld: 82 mg/dL (ref 70–99)
Potassium: 4.3 mEq/L (ref 3.5–5.1)
Sodium: 137 mEq/L (ref 135–145)
Total Bilirubin: 0.5 mg/dL (ref 0.2–1.2)
Total Protein: 7 g/dL (ref 6.0–8.3)

## 2020-12-23 LAB — CBC WITH DIFFERENTIAL/PLATELET
Basophils Absolute: 0 10*3/uL (ref 0.0–0.1)
Basophils Relative: 0.5 % (ref 0.0–3.0)
Eosinophils Absolute: 0.1 10*3/uL (ref 0.0–0.7)
Eosinophils Relative: 3.1 % (ref 0.0–5.0)
HCT: 45.9 % (ref 39.0–52.0)
Hemoglobin: 14.9 g/dL (ref 13.0–17.0)
Lymphocytes Relative: 41.1 % (ref 12.0–46.0)
Lymphs Abs: 1.8 10*3/uL (ref 0.7–4.0)
MCHC: 32.5 g/dL (ref 30.0–36.0)
MCV: 89.1 fl (ref 78.0–100.0)
Monocytes Absolute: 0.3 10*3/uL (ref 0.1–1.0)
Monocytes Relative: 7.3 % (ref 3.0–12.0)
Neutro Abs: 2.1 10*3/uL (ref 1.4–7.7)
Neutrophils Relative %: 48 % (ref 43.0–77.0)
Platelets: 133 10*3/uL — ABNORMAL LOW (ref 150.0–400.0)
RBC: 5.14 Mil/uL (ref 4.22–5.81)
RDW: 14.6 % (ref 11.5–15.5)
WBC: 4.5 10*3/uL (ref 4.0–10.5)

## 2020-12-23 LAB — LIPID PANEL
Cholesterol: 128 mg/dL (ref 0–200)
HDL: 40.6 mg/dL (ref >39.00)
LDL Cholesterol: 77 mg/dL (ref 0–99)
NonHDL: 87.28
Total CHOL/HDL Ratio: 3
Triglycerides: 51 mg/dL (ref 0.0–149.0)
VLDL: 10.2 mg/dL (ref 0.0–40.0)

## 2020-12-23 LAB — TSH: TSH: 4.35 u[IU]/mL (ref 0.35–5.50)

## 2020-12-23 LAB — T4, FREE: Free T4: 0.9 ng/dL (ref 0.60–1.60)

## 2020-12-23 NOTE — Patient Instructions (Addendum)
Recommend to proceed with new covid booster (bivalent) at your pharmacy.     GO TO THE LAB : Get the blood work     [place], [place] Come back for   a physical exam in 1 year

## 2020-12-23 NOTE — Progress Notes (Signed)
Subjective:    Patient ID: Bradley James, male    DOB: 03-27-1980, 40 y.o.   MRN: [id number]  DOS:  12/23/2020 Type of visit - description: cpx Since the last office visit is doing well.  Did develop GI sxs  over the last  3 to 4 days, two other family members have similar symptoms. Having diarrhea, watery without blood. Some nausea but no vomiting.  Wt Readings from Last 3 Encounters:  12/23/20 238 lb 8 oz (108.2 kg)  09/29/20 240 lb 6 oz (109 kg)  01/12/20 246 lb 8 oz (111.8 kg)     Review of Systems  Other than above, a 14 point review of systems is negative     Past Medical History:  Diagnosis Date   Back pain    Elevated LFTs     Past Surgical History:  Procedure Laterality Date   NO PAST SURGERIES     Social History   Socioeconomic History   Marital status: Married    Spouse name: Not on file   Number of children: 2   Years of education: Not on file   Highest education level: Not on file  Occupational History   Occupation: Glass blower/designer     Employer: [employer]  Tobacco Use   Smoking status: Never   Smokeless tobacco: Never  Substance and Sexual Activity   Alcohol use: Yes    Comment: socially    Drug use: No   Sexual activity: Not on file  Other Topics Concern   Not on file  Social History Narrative   Original from [place], married    Children: Designer, fashion/clothing      Social Determinants of Health   Financial Resource Strain: Not on file  Food Insecurity: Not on file  Transportation Needs: Not on file  Physical Activity: Not on file  Stress: Not on file  Social Connections: Not on file  Intimate Partner Violence: Not on file    Allergies as of 12/23/2020   No Known Allergies      Medication List        Accurate as of December 23, 2020 11:59 PM. If you have any questions, ask your nurse or doctor.          STOP taking these medications    azithromycin 250 MG tablet Commonly known as: Zithromax Z-Pak Stopped by: Kathlene November, MD    HYDROcodone bit-homatropine 5-1.5 MG/5ML syrup Commonly known as: HYCODAN Stopped by: Kathlene November, MD           Objective:   Physical Exam BP 116/86 (BP Location: Left Arm, Patient Position: Sitting, Cuff Size: Normal)   Pulse 64   Temp 98.1 F (36.7 C) (Oral)   Resp 16   Ht 5\' 10"  (1.778 m)   Wt 238 lb 8 oz (108.2 kg)   SpO2 98%   BMI 34.22 kg/m  General: Well developed, NAD, BMI noted Neck: No  thyromegaly  HEENT:  Normocephalic . Face symmetric, atraumatic Lungs:  CTA B Normal respiratory effort, no intercostal retractions, no accessory muscle use. Heart: RRR,  no murmur.  Abdomen:  Not distended, soft, non-tender. No rebound or rigidity.   Lower extremities: no pretibial edema bilaterally  Skin: Exposed areas without rash. Not pale. Not jaundice Neurologic:  alert & oriented X3.  Speech normal, gait appropriate for age and unassisted Strength symmetric and appropriate for age.  Psych: Cognition and judgment appear intact.  Cooperative with normal attention span and concentration.  Behavior appropriate. No anxious  or depressed appearing.     Assessment    Assessment Large mole, left leg, h/o (-) Bxs before Increase LFTs: Hep B and C negative 08-2014. 08-2015: Normal ceruloplasmin, iron; [place]: Fatty liver Increased TSH   PLAN Here for CPX Subclinical hypothyroidism: Checking TFTs and anti-TPO. Increase LFTs: Monitoring LFTs today Diarrhea: Symptoms started few days ago, 2 family members also affected, likely viral.  Encourage hydration. Back pain: Had problems with back pain, they are resolved. RTC 1 year  This visit occurred during the SARS-CoV-2 public health emergency.  Safety protocols were in place, including screening questions prior to the visit, additional usage of staff PPE, and extensive cleaning of exam room while observing appropriate contact time as indicated for disinfecting solutions.

## 2020-12-24 ENCOUNTER — Encounter: Payer: Self-pay | Admitting: Internal Medicine

## 2020-12-24 LAB — THYROID PEROXIDASE ANTIBODIES (TPO) (REFL): Thyroperoxidase Ab SerPl-aCnc: <1 IU/mL (ref <9)

## 2020-12-24 NOTE — Assessment & Plan Note (Signed)
Here for CPX Subclinical hypothyroidism: Checking TFTs and anti-TPO. Increase LFTs: Monitoring LFTs today Diarrhea: Symptoms started few days ago, 2 family members also affected, likely viral.  Encourage hydration. Back pain: Had problems with back pain, they are resolved. RTC 1 year

## 2020-12-24 NOTE — Assessment & Plan Note (Signed)
-  Td 2015. -Had COVID vaxs, new booster recommended  -Flu shot today -labs: CMP, FLP, CBC, TSH, T4, anti-TPO -Diet and exercise:Extensive discussion

## 2020-12-27 NOTE — Addendum Note (Signed)
Addended by: Damita Dunnings D on: 12/27/2020 09:07 AM   Modules accepted: Orders

## 2020-12-28 ENCOUNTER — Telehealth (HOSPITAL_BASED_OUTPATIENT_CLINIC_OR_DEPARTMENT_OTHER): Payer: Self-pay

## 2021-02-01 ENCOUNTER — Encounter: Payer: Self-pay | Admitting: Internal Medicine

## 2021-06-09 ENCOUNTER — Encounter: Payer: Self-pay | Admitting: Internal Medicine

## 2021-07-22 ENCOUNTER — Encounter: Payer: Self-pay | Admitting: Internal Medicine

## 2021-07-22 ENCOUNTER — Ambulatory Visit (INDEPENDENT_AMBULATORY_CARE_PROVIDER_SITE_OTHER): Payer: BC Managed Care – PPO | Admitting: Internal Medicine

## 2021-07-22 VITALS — BP 124/80 | HR 69 | Temp 98.3°F | Resp 16 | Ht 70.0 in | Wt 240.2 lb

## 2021-07-22 DIAGNOSIS — M25552 Pain in left hip: Secondary | ICD-10-CM | POA: Diagnosis not present

## 2021-07-22 MED ORDER — PREDNISONE 10 MG PO TABS: ORAL_TABLET | ORAL | 0 refills | Status: DC

## 2021-07-22 NOTE — Progress Notes (Signed)
   Subjective:    Patient ID: Bradley James, male    DOB: [birth year], 41 y.o.   MRN: [id number]  DOS:  07/22/2021 Type of visit - description: acute  Symptoms started 2 weeks ago. Pain is located at the lateral side of the left hip, some radiation to the lateral  side of the left thigh. He denies low back pain per se. 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  However, as he continue to walk and stay active in the factory, the  pain decreases  No lower extremity paresthesias. No injury.  Review of Systems See above   Past Medical History:  Diagnosis Date   Back pain    Elevated LFTs     Past Surgical History:  Procedure Laterality Date   NO PAST SURGERIES      No current outpatient medications     Objective:   Physical Exam BP 124/80   Pulse 69   Temp 98.3 F (36.8 C) (Oral)   Resp 16   Ht '5\' 10"'$  (1.778 m)   Wt 240 lb 4 oz (109 kg)   SpO2 96%   BMI 34.47 kg/m  General:   Well developed, NAD, BMI noted. HEENT:  Normocephalic . Face symmetric, atraumatic MSK: No TTP of the lumbar spine or SI joints Lower extremities: no pretibial edema bilaterally. No TTP at the trochanteric bursa's Passive and active hip rotation normal Skin: Not pale. Not jaundice Neurologic:  alert & oriented X3.  Speech normal, gait appropriate for age and unassisted.  Motor and DTR symmetric Psych--  Cognition and judgment appear intact.  Cooperative with normal attention span and concentration.  Behavior appropriate. No anxious or depressed appearing.      Assessment    ASSESSMENT Large mole, left leg, h/o (-) Bxs before Increase LFTs: Hep B and C negative 08-2014. 08-2015: Normal ceruloplasmin, iron; [place]: Fatty liver Increased TSH   PLAN L Hip pain: As described above, is different compared to the lumbalgia he had in 2021. Suspect tendinitis. Plan: Round of prednisone, Tylenol, continue stretching regularly  before going to work, if not gradually better refer to sports medicine.

## 2021-07-22 NOTE — Assessment & Plan Note (Signed)
L Hip pain: As described above, is different compared to the lumbalgia he had in 2021. Suspect tendinitis. Plan: Round of prednisone, Tylenol, continue stretching regularly before going to work, if not gradually better refer to sports medicine.

## 2021-07-22 NOTE — Patient Instructions (Addendum)
Recommend to proceed with covid booster (bivalent) at your pharmacy.   Prednisone as prescribed  Tylenol if needed  Call if not gradually better  Remember to stretch your back and hips before going to work

## 2021-07-28 ENCOUNTER — Encounter: Payer: Self-pay | Admitting: Internal Medicine

## 2021-08-03 ENCOUNTER — Ambulatory Visit: Payer: BC Managed Care – PPO | Admitting: Internal Medicine

## 2021-12-26 ENCOUNTER — Encounter: Payer: BC Managed Care – PPO | Admitting: Internal Medicine

## 2021-12-26 ENCOUNTER — Telehealth: Payer: Self-pay

## 2021-12-26 ENCOUNTER — Encounter: Payer: Self-pay | Admitting: Internal Medicine

## 2021-12-26 DIAGNOSIS — Z0000 Encounter for general adult medical examination without abnormal findings: Secondary | ICD-10-CM

## 2021-12-26 DIAGNOSIS — E038 Other specified hypothyroidism: Secondary | ICD-10-CM

## 2021-12-26 NOTE — Telephone Encounter (Signed)
Pt forgot his cpx appt this morning, he rescheduled for 02/2022. He is requesting labs prior to appt. Please advise?

## 2021-12-26 NOTE — Telephone Encounter (Signed)
Orders placed. Lab appt already scheduled.

## 2021-12-26 NOTE — Telephone Encounter (Signed)
CMP, FLP, CBC, TSH, free T4 (subclinical hypothyroidism.)

## 2022-03-07 ENCOUNTER — Encounter: Payer: Self-pay | Admitting: Internal Medicine

## 2022-03-08 ENCOUNTER — Encounter: Payer: Self-pay | Admitting: Internal Medicine

## 2022-03-08 ENCOUNTER — Ambulatory Visit (INDEPENDENT_AMBULATORY_CARE_PROVIDER_SITE_OTHER): Payer: BC Managed Care – PPO | Admitting: Internal Medicine

## 2022-03-08 VITALS — BP 126/80 | HR 78 | Temp 98.2°F | Resp 18 | Ht 70.0 in | Wt 245.0 lb

## 2022-03-08 DIAGNOSIS — L03119 Cellulitis of unspecified part of limb: Secondary | ICD-10-CM

## 2022-03-08 DIAGNOSIS — Z23 Encounter for immunization: Secondary | ICD-10-CM

## 2022-03-08 DIAGNOSIS — W57XXXD Bitten or stung by nonvenomous insect and other nonvenomous arthropods, subsequent encounter: Secondary | ICD-10-CM

## 2022-03-08 NOTE — Patient Instructions (Signed)
Completed 10 days of antibiotics  Keep the arm elevated.  Tylenol as needed for pain  If you are not gradually better let us know  Go to the ER if: Severe swelling, increased pain, finger numbness.

## 2022-03-08 NOTE — Progress Notes (Signed)
   Subjective:    Patient ID: Bradley James, male    DOB: [birth year], 42 y.o.   MRN: [id number]  DOS:  03/08/2022 Type of visit - description: Acute  6 days ago had an injury at work, apparently just a contusion, reports no cuts,  openings or bleeding. The area gradually increase in size and swell up . On 03/05/2022 because the swelling, the wife punctured  the area with a needle, they only obtain few drops of blood. The area got worse, went to the UC next day, RX doxycycline.  Here for follow-up Overall area looks less swollen, he has not seen any discharge. No fever or chills. No numbness or tingling at the fingers.  Review of Systems See above   Past Medical History:  Diagnosis Date   Back pain    Elevated LFTs     Past Surgical History:  Procedure Laterality Date   NO PAST SURGERIES      Current Outpatient Medications  Medication Instructions   doxycycline (VIBRA-TABS) 100 mg, Oral, 2 times daily   predniSONE (DELTASONE) 10 MG tablet 4 tablets x 2 days, 3 tabs x 2 days, 2 tabs x 2 days, 1 tab x 2 days       Objective:   Physical Exam BP 126/80   Pulse 78   Temp 98.2 F (36.8 C) (Oral)   Resp 18   Ht '5\' 10"'$  (1.778 m)   Wt 245 lb (111.1 kg)   SpO2 98%   BMI 35.15 kg/m  General:   Well developed, NAD, BMI noted. HEENT:  Normocephalic . Face symmetric, atraumatic Right hand: See picture, + swelling throughout the hand particularly between the thumb and second finger but no fluctuance, he does have opening, unable to get any discharge from there. Move the fingers appropriately, good capillary refills. Skin: Not pale. Not jaundice Neurologic:  alert & oriented X3.  Speech normal, gait appropriate for age and unassisted Psych--  Cognition and judgment appear intact.  Cooperative with normal attention span and concentration.  Behavior appropriate. No anxious or depressed appearing.      Assessment   ASSESSMENT Large mole, left leg, h/o (-) Bxs  before Increase LFTs: Hep B and C negative 08-2014. 08-2015: Normal ceruloplasmin, iron; [place]: Fatty liver Increased TSH   PLAN R hand infection: Symptoms started after contusion last week, see HPI. Started doxycycline a couple days ago, reports area is better. Denies insect or animal bite Plan:  Continue doxycycline, Tdap, hand elevation, Tylenol.  Call if not gradually better, ER if fever, chills, severe swelling, hand numbness. He does have an appointment for a CPX next week, will reassess the area then.

## 2022-03-08 NOTE — Assessment & Plan Note (Addendum)
R hand infection: Symptoms started after contusion last week, see HPI. Started doxycycline a couple days ago, reports area is better. Denies insect or animal bite Plan:  Continue doxycycline, Tdap, hand elevation, Tylenol.  Call if not gradually better, ER if fever, chills, severe swelling, hand numbness. He does have an appointment for a CPX next week, will reassess the area then.the area then.

## 2022-03-09 ENCOUNTER — Other Ambulatory Visit: Payer: BC Managed Care – PPO

## 2022-03-10 ENCOUNTER — Other Ambulatory Visit (INDEPENDENT_AMBULATORY_CARE_PROVIDER_SITE_OTHER): Payer: BC Managed Care – PPO

## 2022-03-10 ENCOUNTER — Other Ambulatory Visit: Payer: BC Managed Care – PPO

## 2022-03-10 DIAGNOSIS — E038 Other specified hypothyroidism: Secondary | ICD-10-CM | POA: Diagnosis not present

## 2022-03-10 DIAGNOSIS — Z0000 Encounter for general adult medical examination without abnormal findings: Secondary | ICD-10-CM

## 2022-03-10 LAB — LIPID PANEL
Cholesterol: 187 mg/dL (ref 0–200)
HDL: 49.7 mg/dL (ref >39.00)
LDL Cholesterol: 116 mg/dL — ABNORMAL HIGH (ref 0–99)
NonHDL: 137.72
Total CHOL/HDL Ratio: 4
Triglycerides: 107 mg/dL (ref 0.0–149.0)
VLDL: 21.4 mg/dL (ref 0.0–40.0)

## 2022-03-10 LAB — CBC WITH DIFFERENTIAL/PLATELET
Basophils Absolute: 0 10*3/uL (ref 0.0–0.1)
Basophils Relative: 0.8 % (ref 0.0–3.0)
Eosinophils Absolute: 0.3 10*3/uL (ref 0.0–0.7)
Eosinophils Relative: 5.1 % — ABNORMAL HIGH (ref 0.0–5.0)
HCT: 47 % (ref 39.0–52.0)
Hemoglobin: 15.6 g/dL (ref 13.0–17.0)
Lymphocytes Relative: 37.2 % (ref 12.0–46.0)
Lymphs Abs: 2.3 10*3/uL (ref 0.7–4.0)
MCHC: 33.2 g/dL (ref 30.0–36.0)
MCV: 88.5 fl (ref 78.0–100.0)
Monocytes Absolute: 0.4 10*3/uL (ref 0.1–1.0)
Monocytes Relative: 7.2 % (ref 3.0–12.0)
Neutro Abs: 3 10*3/uL (ref 1.4–7.7)
Neutrophils Relative %: 49.7 % (ref 43.0–77.0)
Platelets: 153 10*3/uL (ref 150.0–400.0)
RBC: 5.31 Mil/uL (ref 4.22–5.81)
RDW: 14.8 % (ref 11.5–15.5)
WBC: 6.1 10*3/uL (ref 4.0–10.5)

## 2022-03-10 LAB — COMPREHENSIVE METABOLIC PANEL
ALT: 70 U/L — ABNORMAL HIGH (ref 0–53)
AST: 48 U/L — ABNORMAL HIGH (ref 0–37)
Albumin: 4.4 g/dL (ref 3.5–5.2)
Alkaline Phosphatase: 51 U/L (ref 39–117)
BUN: 18 mg/dL (ref 6–23)
CO2: 27 mEq/L (ref 19–32)
Calcium: 9.2 mg/dL (ref 8.4–10.5)
Chloride: 103 mEq/L (ref 96–112)
Creatinine, Ser: 1.08 mg/dL (ref 0.40–1.50)
GFR: 84.39 mL/min (ref >60.00)
Glucose, Bld: 85 mg/dL (ref 70–99)
Potassium: 4.3 mEq/L (ref 3.5–5.1)
Sodium: 139 mEq/L (ref 135–145)
Total Bilirubin: 0.4 mg/dL (ref 0.2–1.2)
Total Protein: 6.9 g/dL (ref 6.0–8.3)

## 2022-03-10 LAB — TSH: TSH: 4.96 u[IU]/mL (ref 0.35–5.50)

## 2022-03-10 LAB — T4, FREE: Free T4: 0.72 ng/dL (ref 0.60–1.60)

## 2022-03-14 ENCOUNTER — Ambulatory Visit (INDEPENDENT_AMBULATORY_CARE_PROVIDER_SITE_OTHER): Payer: BC Managed Care – PPO | Admitting: Internal Medicine

## 2022-03-14 ENCOUNTER — Encounter: Payer: Self-pay | Admitting: Internal Medicine

## 2022-03-14 VITALS — BP 122/80 | HR 69 | Temp 98.0°F | Resp 16 | Ht 70.0 in | Wt 245.5 lb

## 2022-03-14 DIAGNOSIS — Z0000 Encounter for general adult medical examination without abnormal findings: Secondary | ICD-10-CM

## 2022-03-14 DIAGNOSIS — Z23 Encounter for immunization: Secondary | ICD-10-CM | POA: Diagnosis not present

## 2022-03-14 MED ORDER — CEPHALEXIN 500 MG PO CAPS: 500.0000 mg | ORAL_CAPSULE | Freq: Four times a day (QID) | ORAL | 0 refills | Status: DC

## 2022-03-14 NOTE — Patient Instructions (Addendum)
Stop the oral antibiotic Start Keflex x 5 days.  Keep the area clean and dry Apply hydrocortisone 1% over-the-counter twice daily to help with itching.  If the area is not back to normal in a couple of weeks or if it get worse: Let me know  Vaccines I recommend:  Covid booster    GO TO [place], [place] back for   checkup in 6 months

## 2022-03-14 NOTE — Progress Notes (Signed)
Subjective:    Patient ID: Bradley James, male    DOB: 20-Mar-1980, 42 y.o.   MRN: [id number]  DOS:  03/14/2022 Type of visit - description: CPX  Was seen last week with a hand infection, on antibiotics, better.  Still able to squeeze some discharge from the area. No fever or chills.  Review of Systems  Other than above, a 14 point review of systems is negative    Past Medical History:  Diagnosis Date   Back pain    Elevated LFTs     Past Surgical History:  Procedure Laterality Date   NO PAST SURGERIES     Social History   Socioeconomic History   Marital status: Married    Spouse name: Not on file   Number of children: 2   Years of education: Not on file   Highest education level: Not on file  Occupational History   Occupation: Glass blower/designer     Employer: [employer]  Tobacco Use   Smoking status: Never   Smokeless tobacco: Never  Substance and Sexual Activity   Alcohol use: Yes    Comment: socially    Drug use: No   Sexual activity: Not on file  Other Topics Concern   Not on file  Social History Narrative   Original from [place], married    Children: Designer, fashion/clothing      Social Determinants of Radio broadcast assistant Strain: Not on file  Food Insecurity: Not on file  Transportation Needs: Not on file  Physical Activity: Not on file  Stress: Not on file  Social Connections: Not on file  Intimate Partner Violence: Not on file    Current Outpatient Medications  Medication Instructions   cephALEXin (KEFLEX) 500 mg, Oral, 4 times daily       Objective:   Physical Exam BP 122/80   Pulse 69   Temp 98 F (36.7 C) (Oral)   Resp 16   Ht '5\' 10"'$  (1.778 m)   Wt 245 lb 8 oz (111.4 kg)   SpO2 97%   BMI 35.23 kg/m  General: Well developed, NAD, BMI noted Neck: No  thyromegaly  HEENT:  Normocephalic . Face symmetric, atraumatic Lungs:  CTA B Normal respiratory effort, no intercostal retractions, no accessory muscle use. Heart: RRR,  no murmur.   Abdomen:  Not distended, soft, non-tender. No rebound or rigidity.   Lower extremities: no pretibial edema bilaterally Right hand: See LOV, area is better, still has induration, around 2 x 1.5 cm.  Has a small opening and I am able to squeeze a small amount of discharge.  No abscess or fluctuance. Skin: Exposed areas without rash. Not pale. Not jaundice Neurologic:  alert & oriented X3.  Speech normal, gait appropriate for age and unassisted Strength symmetric and appropriate for age.  Psych: Cognition and judgment appear intact.  Cooperative with normal attention span and concentration.  Behavior appropriate. No anxious or depressed appearing.     Assessment     ASSESSMENT Large mole, left leg, h/o (-) Bxs before Increase LFTs: Hep B and C negative 08-2014. 08-2015: Normal ceruloplasmin, iron; [place]: Fatty liver Increased TSH   PLAN Here for CPX. Increased LFTs: Stable R hand infection: On doxycycline, about to finish the course, compared to last week is improving but not completely well. Plan: Switch to Keflex.  Call if not back to normal in a couple of weeks.  Use hydrocortisone to prevent itching which he is complaining about. Obesity: Concerned about his  weight, he is trying to stay active, already reach out to the weight and wellness clinic and plans to start with them soon. Praised. RTC 6 months

## 2022-03-15 ENCOUNTER — Encounter: Payer: Self-pay | Admitting: Internal Medicine

## 2022-03-15 NOTE — Assessment & Plan Note (Signed)
-  Td 02-2022. - COVID vax  recommended  -Flu shot today -labs: Recent labs reviewed, cholesterol slightly elevated compared to last year.  Plans to do better with diet and exercise. -Diet and exercise: See comments under obesity.

## 2022-03-15 NOTE — Assessment & Plan Note (Signed)
Here for CPX. Increased LFTs: Stable R hand infection: On doxycycline, about to finish the course, compared to last week is improving but not completely well. Plan: Switch to Keflex.  Call if not back to normal in a couple of weeks.  Use hydrocortisone to prevent itching which he is complaining about. Obesity: Concerned about his weight, he is trying to stay active, already reach out to the weight and wellness clinic and plans to start with them soon. Praised. RTC 6 months

## 2022-03-20 ENCOUNTER — Encounter: Payer: Self-pay | Admitting: Internal Medicine

## 2022-04-24 DIAGNOSIS — Z0289 Encounter for other administrative examinations: Secondary | ICD-10-CM

## 2022-04-25 ENCOUNTER — Encounter: Payer: Self-pay | Admitting: Bariatrics

## 2022-04-25 ENCOUNTER — Ambulatory Visit (INDEPENDENT_AMBULATORY_CARE_PROVIDER_SITE_OTHER): Payer: BC Managed Care – PPO | Admitting: Bariatrics

## 2022-04-25 VITALS — BP 128/83 | HR 57 | Temp 97.6°F | Ht 70.0 in | Wt 236.0 lb

## 2022-04-25 DIAGNOSIS — R7989 Other specified abnormal findings of blood chemistry: Secondary | ICD-10-CM

## 2022-04-25 DIAGNOSIS — R0602 Shortness of breath: Secondary | ICD-10-CM

## 2022-04-25 DIAGNOSIS — E669 Obesity, unspecified: Secondary | ICD-10-CM

## 2022-04-25 DIAGNOSIS — E038 Other specified hypothyroidism: Secondary | ICD-10-CM | POA: Diagnosis not present

## 2022-04-25 DIAGNOSIS — E559 Vitamin D deficiency, unspecified: Secondary | ICD-10-CM | POA: Diagnosis not present

## 2022-04-25 DIAGNOSIS — R7309 Other abnormal glucose: Secondary | ICD-10-CM

## 2022-04-25 DIAGNOSIS — Z0000 Encounter for general adult medical examination without abnormal findings: Secondary | ICD-10-CM

## 2022-04-25 DIAGNOSIS — Z1331 Encounter for screening for depression: Secondary | ICD-10-CM

## 2022-04-25 DIAGNOSIS — Z6833 Body mass index (BMI) 33.0-33.9, adult: Secondary | ICD-10-CM

## 2022-04-25 DIAGNOSIS — R5383 Other fatigue: Secondary | ICD-10-CM | POA: Insufficient documentation

## 2022-04-26 ENCOUNTER — Encounter (INDEPENDENT_AMBULATORY_CARE_PROVIDER_SITE_OTHER): Payer: Self-pay | Admitting: Bariatrics

## 2022-04-26 DIAGNOSIS — K760 Fatty (change of) liver, not elsewhere classified: Secondary | ICD-10-CM | POA: Insufficient documentation

## 2022-04-26 DIAGNOSIS — R7303 Prediabetes: Secondary | ICD-10-CM | POA: Insufficient documentation

## 2022-04-26 LAB — HEMOGLOBIN A1C
Est. average glucose Bld gHb Est-mCnc: 123 mg/dL
Hgb A1c MFr Bld: 5.9 % — ABNORMAL HIGH (ref 4.8–5.6)

## 2022-04-26 LAB — INSULIN, RANDOM: INSULIN: 22 u[IU]/mL (ref 2.6–24.9)

## 2022-04-26 LAB — VITAMIN D 25 HYDROXY (VIT D DEFICIENCY, FRACTURES): Vit D, 25-Hydroxy: 22.3 ng/mL — ABNORMAL LOW (ref 30.0–100.0)

## 2022-04-26 LAB — COMPREHENSIVE METABOLIC PANEL
ALT: 53 IU/L — ABNORMAL HIGH (ref 0–44)
AST: 39 IU/L (ref 0–40)
Albumin/Globulin Ratio: 2 (ref 1.2–2.2)
Albumin: 4.9 g/dL (ref 4.1–5.1)
Alkaline Phosphatase: 55 IU/L (ref 44–121)
BUN/Creatinine Ratio: 14 (ref 9–20)
BUN: 15 mg/dL (ref 6–24)
Bilirubin Total: 0.3 mg/dL (ref 0.0–1.2)
CO2: 24 mmol/L (ref 20–29)
Calcium: 9.5 mg/dL (ref 8.7–10.2)
Chloride: 102 mmol/L (ref 96–106)
Creatinine, Ser: 1.04 mg/dL (ref 0.76–1.27)
Globulin, Total: 2.4 g/dL (ref 1.5–4.5)
Glucose: 83 mg/dL (ref 70–99)
Potassium: 4.3 mmol/L (ref 3.5–5.2)
Sodium: 140 mmol/L (ref 134–144)
Total Protein: 7.3 g/dL (ref 6.0–8.5)
eGFR: 91 mL/min/{1.73_m2} (ref >59)

## 2022-05-08 ENCOUNTER — Encounter: Payer: Self-pay | Admitting: Internal Medicine

## 2022-05-08 MED ORDER — DOXYCYCLINE HYCLATE 100 MG PO TABS: 100.0000 mg | ORAL_TABLET | Freq: Two times a day (BID) | ORAL | 0 refills | Status: DC

## 2022-05-09 ENCOUNTER — Encounter: Payer: Self-pay | Admitting: Bariatrics

## 2022-05-09 ENCOUNTER — Ambulatory Visit (INDEPENDENT_AMBULATORY_CARE_PROVIDER_SITE_OTHER): Payer: BC Managed Care – PPO | Admitting: Bariatrics

## 2022-05-09 ENCOUNTER — Telehealth: Payer: Self-pay

## 2022-05-09 VITALS — BP 112/71 | HR 87 | Temp 98.4°F | Ht 70.0 in | Wt 232.0 lb

## 2022-05-09 DIAGNOSIS — Z6833 Body mass index (BMI) 33.0-33.9, adult: Secondary | ICD-10-CM | POA: Diagnosis not present

## 2022-05-09 DIAGNOSIS — E559 Vitamin D deficiency, unspecified: Secondary | ICD-10-CM

## 2022-05-09 DIAGNOSIS — R7303 Prediabetes: Secondary | ICD-10-CM

## 2022-05-09 DIAGNOSIS — E669 Obesity, unspecified: Secondary | ICD-10-CM

## 2022-05-09 MED ORDER — VITAMIN D (ERGOCALCIFEROL) 1.25 MG (50000 UNIT) PO CAPS: 50000.0000 [IU] | ORAL_CAPSULE | ORAL | 0 refills | Status: DC

## 2022-05-09 MED ORDER — METFORMIN HCL 500 MG PO TABS: 500.0000 mg | ORAL_TABLET | Freq: Every day | ORAL | 0 refills | Status: DC

## 2022-05-09 NOTE — Telephone Encounter (Signed)
Received call from [facility] at [place]- wanted to let us know that Pt picked up 14 tablets of doxycycline '100mg'$  yesterday 05/09/22- was written by teledoc Dr. Philmore Pali. Informed not to fill prescription sent by Dr. Larose Kells. Belenda Cruise verbalized understanding.

## 2022-05-10 NOTE — Progress Notes (Deleted)
Chief Complaint:   OBESITY Bradley James is here to discuss his progress with his obesity treatment plan along with follow-up of his obesity related diagnoses. Bradley James is on {dwwsldiets:29085} and states he is following his eating plan approximately ***% of the time. Bradley James states he is *** *** minutes *** times per week.  Today's visit was #: *** Starting weight: *** lbs Starting date: *** Today's weight: *** lbs Today's date: *** Total lbs lost to date: *** Total lbs lost since last in-office visit: ***  Interim History: ***  Subjective:   1. Other fatigue ***  2. Shortness of breath ***  3. Subclinical hypothyroidism ***  4. Elevated LFTs ***  5. Healthcare maintenance ***  6. Vitamin D deficiency ***  7. Elevated glucose ***  8. Depression screen ***  9. Generalized obesity ***  10. BMI 33.0-33.9,adult ***   Assessment/Plan:   1. Other fatigue ***  2. Shortness of breath ***  3. Subclinical hypothyroidism ***  4. Elevated LFTs *** - Comprehensive metabolic panel  5. Healthcare maintenance ***  6. Vitamin D deficiency *** - VITAMIN D 25 Hydroxy (Vit-D Deficiency, Fractures)  7. Elevated glucose *** - Hemoglobin A1c - Insulin, random  8. Depression screen ***  9. Generalized obesity ***  10. BMI 33.0-33.9,adult ***  Bradley James {CHL AMB IS/IS NOT:[id number]} currently in the action stage of change. As such, his goal is to {MWMwtloss#1:210800005}. He has agreed to {dwwsldiets:29085}.  Exercise goals: {MWM EXERCISE RECS:23473}  Behavioral modification strategies: {MWMwtlossdietstrategies3:23432}.  Bradley James has agreed to follow-up with our clinic in {NUMBER 1-10:22536} weeks. He was informed of the importance of frequent follow-up visits to maximize his success with intensive lifestyle modifications for his multiple health conditions.   ***delete paragraph if no labs orderedJuan was informed we would discuss his lab results at his next visit  unless there is a critical issue that needs to be addressed sooner. Bradley James agreed to keep his next visit at the agreed upon time to discuss these results.  Objective:   Blood pressure 128/83, pulse (!) 57, temperature 97.6 F (36.4 C), height '5\' 10"'$  (1.778 m), weight 236 lb (107 kg), SpO2 99 %. Body mass index is 33.86 kg/m.  General: Cooperative, alert, well developed, in no acute distress. HEENT: Conjunctivae and lids unremarkable. Cardiovascular: Regular rhythm.  Lungs: Normal work of breathing. Neurologic: No focal deficits.   Lab Results  Component Value Date   CREATININE 1.04 04/25/2022   BUN 15 04/25/2022   NA 140 04/25/2022   K 4.3 04/25/2022   CL 102 04/25/2022   CO2 24 04/25/2022   Lab Results  Component Value Date   ALT 53 (H) 04/25/2022   AST 39 04/25/2022   ALKPHOS 55 04/25/2022   BILITOT 0.3 04/25/2022   Lab Results  Component Value Date   HGBA1C 5.9 (H) 04/25/2022   Lab Results  Component Value Date   INSULIN 22.0 04/25/2022   Lab Results  Component Value Date   TSH 4.96 03/10/2022   Lab Results  Component Value Date   CHOL 187 03/10/2022   HDL 49.70 03/10/2022   LDLCALC 116 (H) 03/10/2022   LDLDIRECT 141.6 12/24/2006   TRIG 107.0 03/10/2022   CHOLHDL 4 03/10/2022   Lab Results  Component Value Date   VD25OH 22.3 (L) 04/25/2022   Lab Results  Component Value Date   WBC 6.1 03/10/2022   HGB 15.6 03/10/2022   HCT 47.0 03/10/2022   MCV 88.5 03/10/2022   PLT 153.0 03/10/2022  Lab Results  Component Value Date   IRON 64 09/24/2015   TIBC 282 09/24/2015    Attestation Statements:   Reviewed by clinician on day of visit: allergies, medications, problem list, medical history, surgical history, family history, social history, and previous encounter notes.  ***(delete if time-based billing not used)Time spent on visit including pre-visit chart review and post-visit care and charting was *** minutes.   I, ***, am acting as  transcriptionist for ***.  I have reviewed the above documentation for accuracy and completeness, and I agree with the above. -  ***

## 2022-05-15 NOTE — Progress Notes (Signed)
Chief Complaint:   Bradley James (MR# [id number]) is a 42 y.o. male who presents for evaluation and treatment of obesity and related comorbidities. Current BMI is Body mass index is 33.86 kg/m. Bradley James has been struggling with his weight for many years and has been unsuccessful in either losing weight, maintaining weight loss, or reaching his healthy weight goal.  Bradley James is currently in the action stage of change and ready to dedicate time achieving and maintaining a healthier weight. Bradley James is interested in becoming our patient and working on intensive lifestyle modifications including (but not limited to) diet and exercise for weight loss.  Bradley James's habits were reviewed today and are as follows: His family eats meals together, he thinks his family will eat healthier with him, his desired weight loss is 36 lbs, he has been heavy most of his life, he started gaining weight 10 years ago, his heaviest weight ever was 243 pounds, he has significant food cravings issues, he snacks frequently in the evenings, he is frequently drinking liquids with calories, he frequently makes poor food choices, he has problems with excessive hunger, he frequently eats larger portions than normal, and he struggles with emotional eating.  Depression Screen Bradley James's Food and Mood (modified PHQ-9) score was 6.  Subjective:   1. Other fatigue Bradley James admits to daytime somnolence and admits to waking up still tired. Patient has a history of symptoms of daytime fatigue and morning fatigue. Bradley James generally gets 6 hours of sleep per night, and states that he has nightime awakenings and generally restful sleep. Snoring is present. Apneic episodes are present. Epworth Sleepiness Score is 1.  Discussed the implications for his plan and exercise based on RMR reading, interpreted.  2. Shortness of breath Bradley James notes increasing shortness of breath with exercising and seems to be worsening over time with weight gain. He notes getting  out of breath sooner with activity than he used to. This has not gotten worse recently. Bradley James denies shortness of breath at rest or orthopnea.  3. Subclinical hypothyroidism Patient is not taking any medication.  Last TSH normal.  On 10/21 increased to 5.89.  4. Elevated LFTs AST 48, ALT 70 decreased from previous readings.  Patient had an ultrasound in 2017, hepatic steatosis.  5. Healthcare maintenance Obesity.  6. Vitamin D deficiency Patient is taking OTC vitamins.  7. Elevated glucose Patient is not taking any medications.  Assessment/Plan:   1. Other fatigue Bradley James does feel that his weight is causing his energy to be lower than it should be. Fatigue may be related to obesity, depression or many other causes. Labs will be ordered, and in the meanwhile, Bradley James will focus on self care including making healthy food choices, increasing physical activity and focusing on stress reduction.  2. Shortness of breath Bradley James does feel that he gets out of breath more easily that he used to when he exercises. Bradley James's shortness of breath appears to be obesity related and exercise induced. He has agreed to work on weight loss and gradually increase exercise to treat his exercise induced shortness of breath. Will continue to monitor closely.  3. Subclinical hypothyroidism Will follow.  4. Elevated LFTs Patient is to work on diet and exercise.  Check labs today.  - Comprehensive metabolic panel  5. Healthcare maintenance Check labs today, IC, EKG.  6. Vitamin D deficiency Check labs today.  - VITAMIN D 25 Hydroxy (Vit-D Deficiency, Fractures)  7. Elevated glucose Check labs today.  - Hemoglobin A1c -  Insulin, random  8. Depression screen Bradley James had a positive depression screening. Depression is commonly associated with obesity and often results in emotional eating behaviors. We will monitor this closely and work on CBT to help improve the non-hunger eating patterns. Referral to Psychology  may be required if no improvement is seen as he continues in our clinic.  9. Generalized obesity  10. BMI 33.0-33.9,adult 1.  Meal planning. 2.  Increase water and protein intake. 3.  Reviewed labs from 03/10/2022, CMP, lipids, CBC. 4.  Will stop regular soda. 5.  Decrease portion sizes.  Bradley James is currently in the action stage of change and his goal is to continue with weight loss efforts. I recommend Bradley James begin the structured treatment plan as follows:  He has agreed to the Category 4 Plan +100 cal.  Exercise goals: Going to the gym.  Behavioral modification strategies: increasing lean protein intake, decreasing simple carbohydrates, increasing vegetables, increasing water intake, decreasing eating out, no skipping meals, meal planning and cooking strategies, keeping healthy foods in the home, and planning for success.  He was informed of the importance of frequent follow-up visits to maximize his success with intensive lifestyle modifications for his multiple health conditions. He was informed we would discuss his lab results at his next visit unless there is a critical issue that needs to be addressed sooner. Bradley James agreed to keep his next visit at the agreed upon time to discuss these results.  Objective:   Blood pressure 128/83, pulse (!) 57, temperature 97.6 F (36.4 C), height 5\' 10"  (1.778 m), weight 236 lb (107 kg), SpO2 99 %. Body mass index is 33.86 kg/m.  EKG: Normal sinus rhythm  Indirect Calorimeter completed today shows a VO2 of 327 and a REE of 2261.  His calculated basal metabolic rate is [id number] thus his basal metabolic rate is worse than expected.  General: Cooperative, alert, well developed, in no acute distress. HEENT: Conjunctivae and lids unremarkable. Cardiovascular: Regular rhythm.  Lungs: Normal work of breathing. Neurologic: No focal deficits.   Lab Results  Component Value Date   CREATININE 1.04 04/25/2022   BUN 15 04/25/2022   NA 140 04/25/2022   K 4.3  04/25/2022   CL 102 04/25/2022   CO2 24 04/25/2022   Lab Results  Component Value Date   ALT 53 (H) 04/25/2022   AST 39 04/25/2022   ALKPHOS 55 04/25/2022   BILITOT 0.3 04/25/2022   Lab Results  Component Value Date   HGBA1C 5.9 (H) 04/25/2022   Lab Results  Component Value Date   INSULIN 22.0 04/25/2022   Lab Results  Component Value Date   TSH 4.96 03/10/2022   Lab Results  Component Value Date   CHOL 187 03/10/2022   HDL 49.70 03/10/2022   LDLCALC 116 (H) 03/10/2022   LDLDIRECT 141.6 12/24/2006   TRIG 107.0 03/10/2022   CHOLHDL 4 03/10/2022   Lab Results  Component Value Date   WBC 6.1 03/10/2022   HGB 15.6 03/10/2022   HCT 47.0 03/10/2022   MCV 88.5 03/10/2022   PLT 153.0 03/10/2022   Lab Results  Component Value Date   IRON 64 09/24/2015   TIBC 282 09/24/2015   Attestation Statements:   Reviewed by clinician on day of visit: allergies, medications, problem list, medical history, surgical history, family history, social history, and previous encounter notes.   Time spent on visit including pre-visit chart review and post-visit charting and care was 40 minutes.    Delfina Redwood, am acting  as transcriptionist for [employer], DO.  I have reviewed the above documentation for accuracy and completeness, and I agree with the above. Jearld Lesch, DO

## 2022-05-16 IMAGING — CT CT RENAL STONE PROTOCOL
2 of 4 series · 16 of 46 positions shown, 18 images · non-contrast
Comparison: None.

CLINICAL DATA: Back pain

EXAM:
CT ABDOMEN AND PELVIS WITHOUT CONTRAST
TECHNIQUE: Multidetector CT imaging of the abdomen and pelvis was performed
following the standard protocol without IV contrast.

[Series 2: axial st · axial · 0.88mm/px · z∈[-355,+145]mm · 13 of 110 slices shown, 15 images]
[im 5/110  soft-tissue]
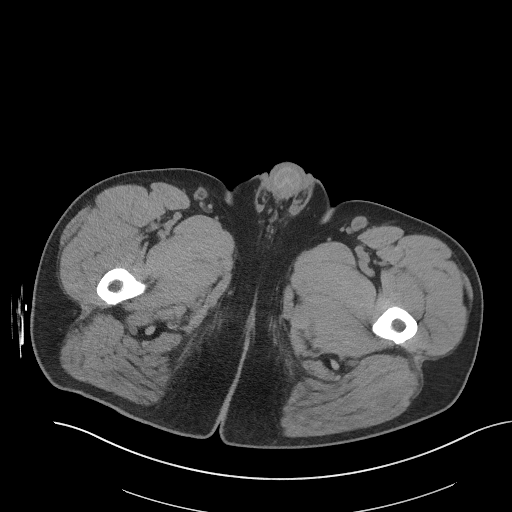
[im 5/110  bone]
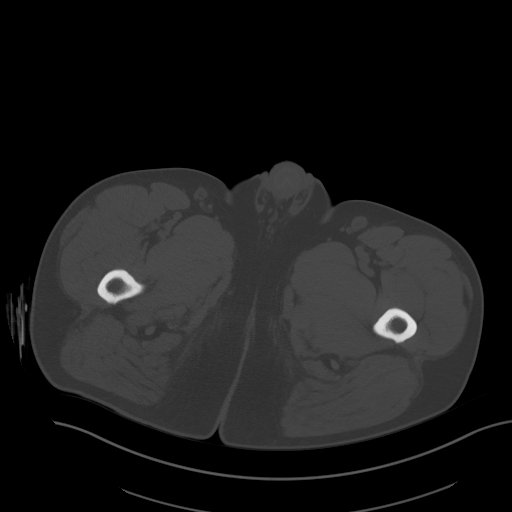
[im 14/110  soft-tissue]
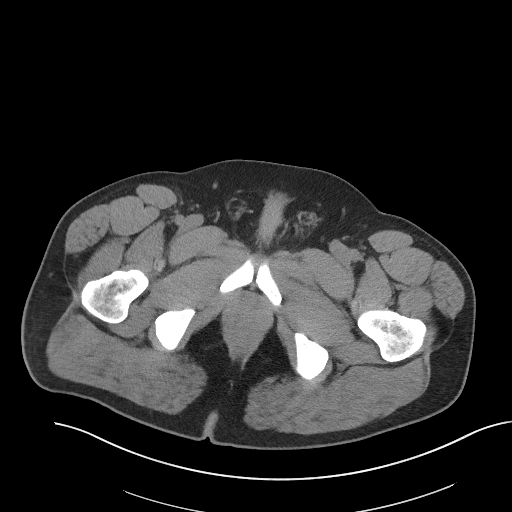
[im 23/110  soft-tissue]
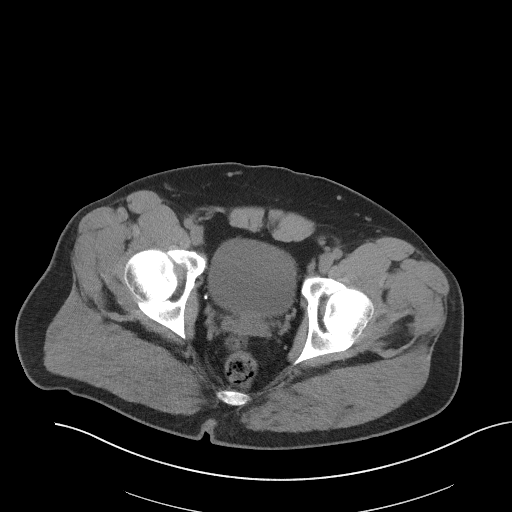
[im 32/110  soft-tissue]
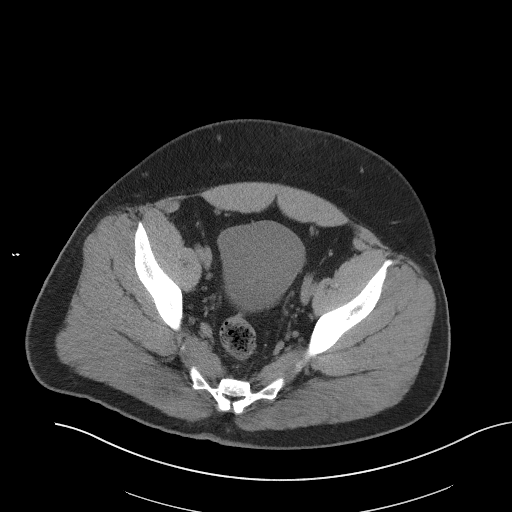
[im 37/110  soft-tissue]
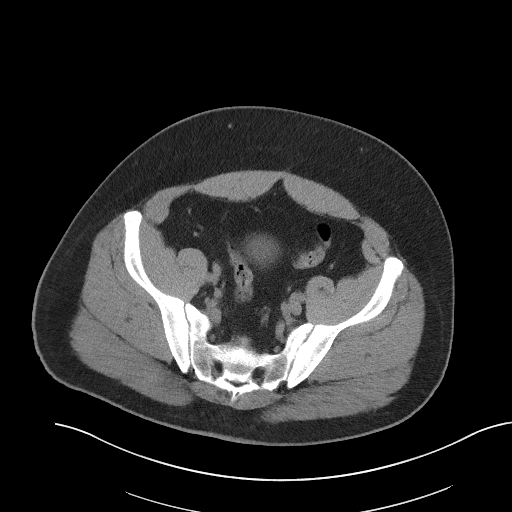
[im 46/110  soft-tissue]
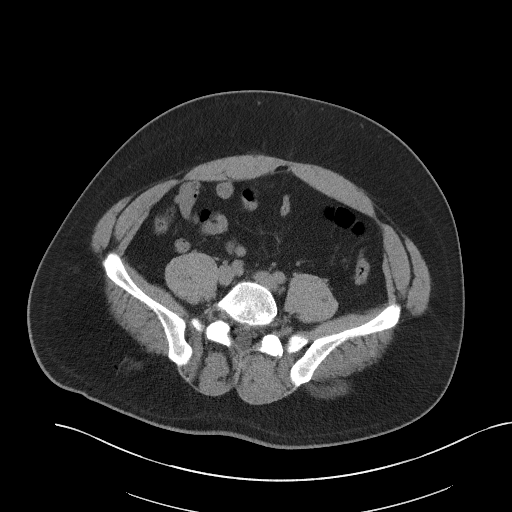
[im 55/110  soft-tissue]
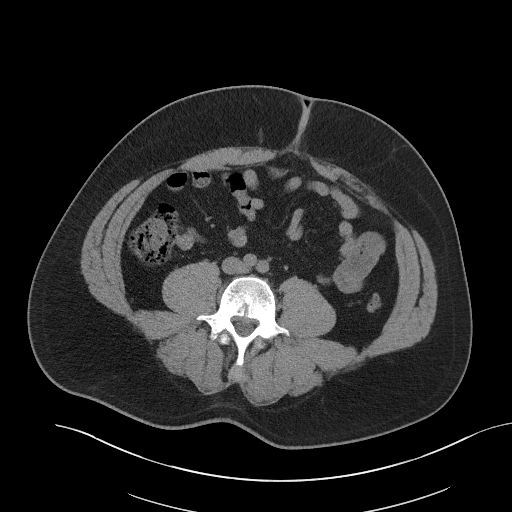
[im 64/110  soft-tissue]
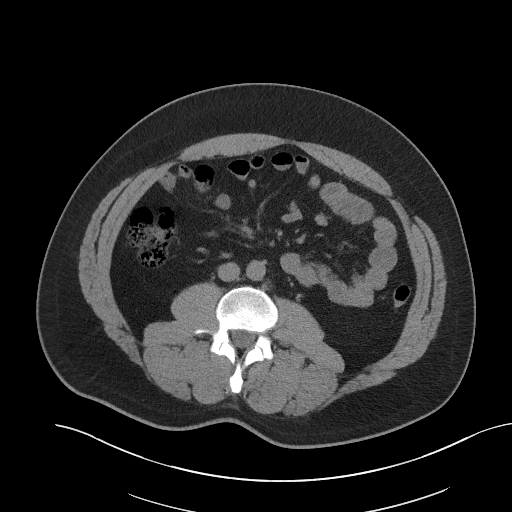
[im 73/110  soft-tissue]
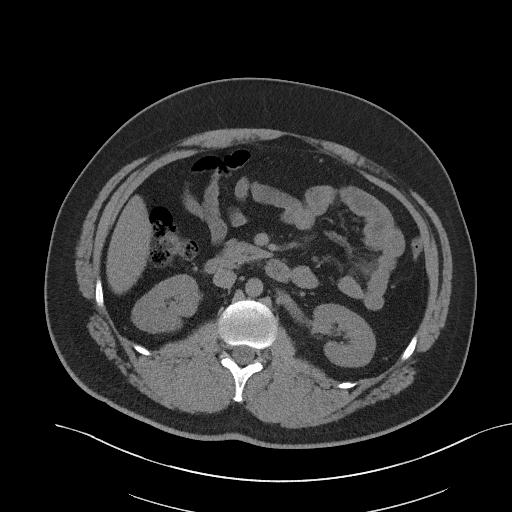
[im 73/110  bone]
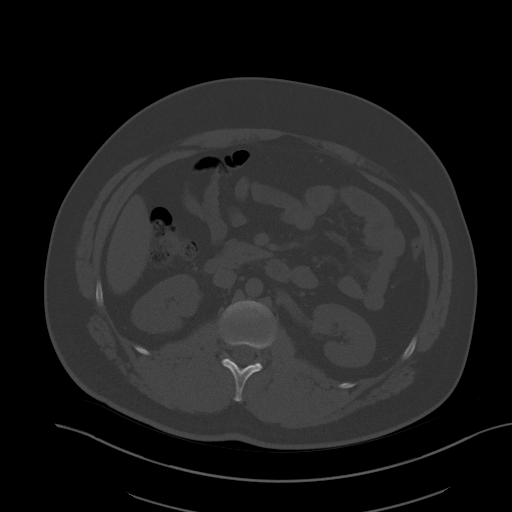
[im 78/110  soft-tissue]
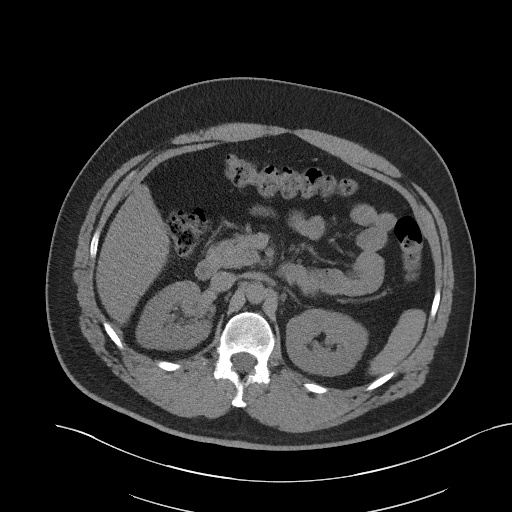
[im 87/110  soft-tissue]
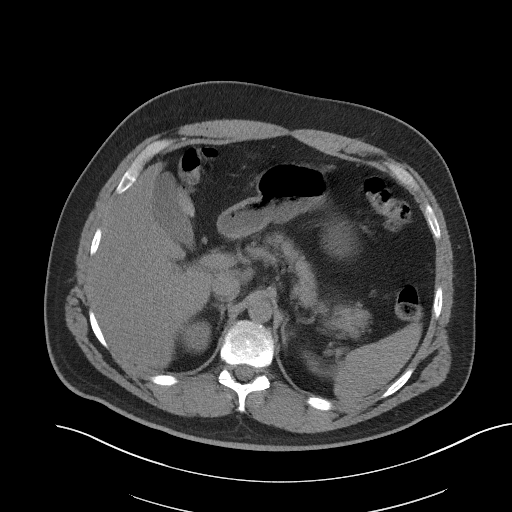
[im 96/110  soft-tissue]
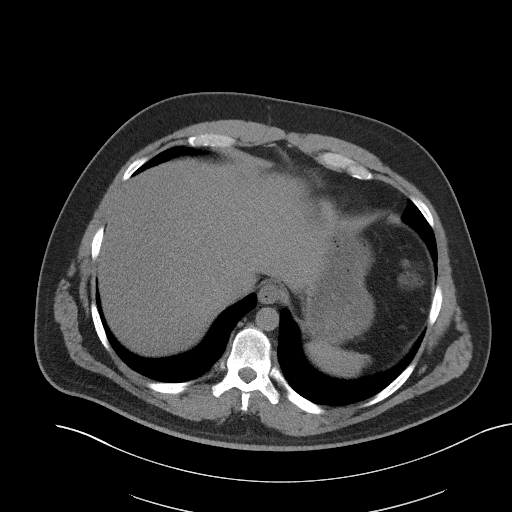
[im 105/110  soft-tissue]
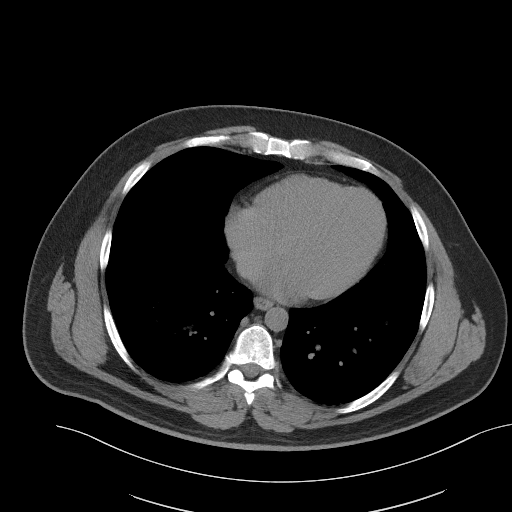

[Series 5: coronal st · coronal · 0.83mm/px · 3 of 115 slices shown]
[im 39/115  soft-tissue]
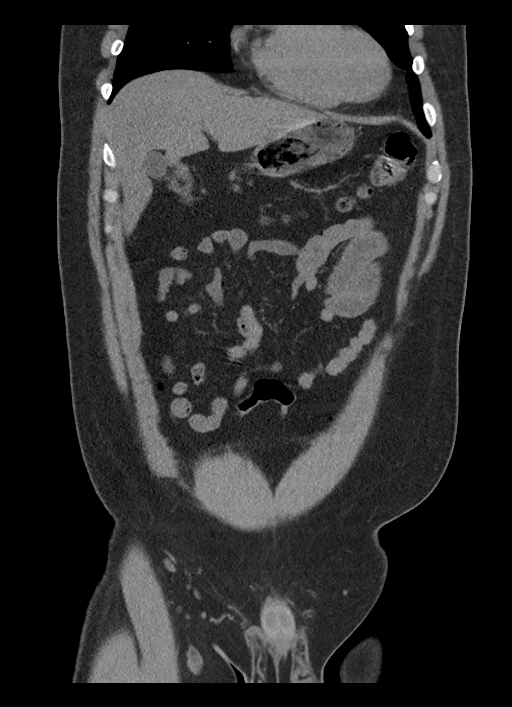
[im 51/115  soft-tissue]
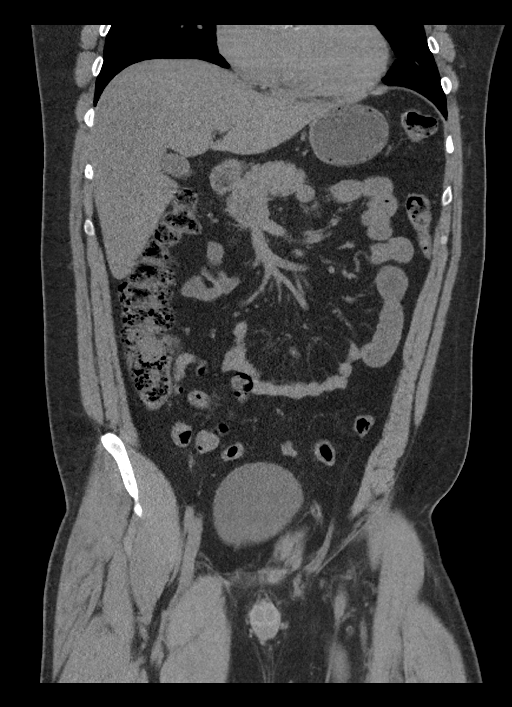
[im 64/115  soft-tissue]
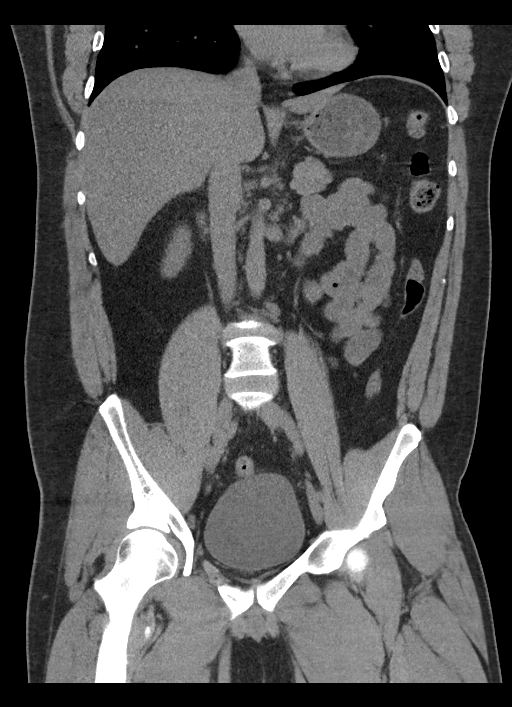

[16 of 46 positions shown; findings below may reference images not displayed]

FINDINGS: Lower chest: No acute abnormality.

Hepatobiliary: Unremarkable noncontrast appearance of the liver.
Gallbladder is unremarkable.

Pancreas: No peripancreatic fat stranding.

Spleen: Spleen is unremarkable.

Adrenals/Urinary Tract: Adrenal glands are unremarkable. No
nephrolithiasis. No hydronephrosis. Bladder is unremarkable.

Stomach/Bowel: Stomach is within normal limits. Appendix appears
normal. No evidence of bowel wall thickening, distention, or
inflammatory changes.

Vascular/Lymphatic: No significant vascular findings are present. No
enlarged abdominal or pelvic lymph nodes.

Reproductive: Prostate is unremarkable.

Other: No abdominal wall hernia or abnormality. No abdominopelvic
ascites.

Musculoskeletal: Bilateral pars defects at L3, age indeterminate but
favored chronic.
IMPRESSION: 1. No acute intra-abdominal process.
2. Bilateral pars defects at L3, age indeterminate but favored
chronic.

## 2022-05-17 NOTE — Progress Notes (Signed)
Chief Complaint:   OBESITY Bradley James is here to discuss his progress with his obesity treatment plan along with follow-up of his obesity related diagnoses. Bradley James is on the Category 4 plan and states he is following his eating plan approximately 50% of the time. Bradley James states he is exercising at the gym for 60 minutes 1-5 times per week.  Today's visit was #: 2 Starting weight: 236 lbs Starting date: 04/25/2022 Today's weight: 232 lbs Today's date: 05/09/2022 Total lbs lost to date: 4 Total lbs lost since last in-office visit: -4  Interim History: He is down 4 pounds since his last visit.  He feels hungry at night.  He is staying with the meal plan better.  Subjective:   1. Pre-diabetes No medications. Maternal grandfather-diabetes mellitus. A1c-5.9. Insulin-22.0.  2. Vitamin D deficiency No vitamin D supplementation. Vitamin D-22.3.  3. Polyphagia After dinner. Assessment/Plan:   1. Pre-diabetes New prescription- - metFORMIN (GLUCOPHAGE) 500 MG tablet; Take 1 tablet (500 mg total) by mouth daily with supper.  Dispense: 30 tablet; Refill: 0  2. Vitamin D deficiency New prescription- - Vitamin D, Ergocalciferol, (DRISDOL) 1.25 MG (50000 UNIT) CAPS capsule; Take 1 capsule (50,000 Units total) by mouth every 7 (seven) days.  Dispense: 5 capsule; Refill: 0  3. Polyphagia 1.  If you go to the restaurant, ask for to go box-one half in box and bring back home. 2.  Handout -Eating Out. 3.  Decrease rice, increase meat, salsa.  4. Generalized obesity BMI 33.0-33.9,adult 1.  Meal planning. 2.  Reviewed labs from 04/25/2022: CMP, vitamin D, A1c, glucose, insulin. 3.  Raw vegetables. 4.  Increase water or Gatorade.  Bradley James is currently in the action stage of change. As such, his goal is to continue with weight loss efforts. He has agreed to the Category 4 plan.  Exercise goals: as is  Behavioral modification strategies: increasing lean protein intake, decreasing simple  carbohydrates, increasing vegetables, increasing water intake, decreasing eating out, no skipping meals, meal planning and cooking strategies, keeping healthy foods in the home, and planning for success.  Bradley James has agreed to follow-up with our clinic in 2 weeks with Colletta [place] and 4 weeks with me.   He was informed of the importance of frequent follow-up visits to maximize his success with intensive lifestyle modifications for his multiple health conditions.   Objective:   Blood pressure 112/71, pulse 87, temperature 98.4 F (36.9 C), height 5\' 10"  (1.778 m), weight 232 lb (105.2 kg), SpO2 99 %. Body mass index is 33.29 kg/m.  General: Cooperative, alert, well developed, in no acute distress. HEENT: Conjunctivae and lids unremarkable. Cardiovascular: Regular rhythm.  Lungs: Normal work of breathing. Neurologic: No focal deficits.   Lab Results  Component Value Date   CREATININE 1.04 04/25/2022   BUN 15 04/25/2022   NA 140 04/25/2022   K 4.3 04/25/2022   CL 102 04/25/2022   CO2 24 04/25/2022   Lab Results  Component Value Date   ALT 53 (H) 04/25/2022   AST 39 04/25/2022   ALKPHOS 55 04/25/2022   BILITOT 0.3 04/25/2022   Lab Results  Component Value Date   HGBA1C 5.9 (H) 04/25/2022   Lab Results  Component Value Date   INSULIN 22.0 04/25/2022   Lab Results  Component Value Date   TSH 4.96 03/10/2022   Lab Results  Component Value Date   CHOL 187 03/10/2022   HDL 49.70 03/10/2022   LDLCALC 116 (H) 03/10/2022   LDLDIRECT 141.6 12/24/2006  TRIG 107.0 03/10/2022   CHOLHDL 4 03/10/2022   Lab Results  Component Value Date   VD25OH 22.3 (L) 04/25/2022   Lab Results  Component Value Date   WBC 6.1 03/10/2022   HGB 15.6 03/10/2022   HCT 47.0 03/10/2022   MCV 88.5 03/10/2022   PLT 153.0 03/10/2022   Lab Results  Component Value Date   IRON 64 09/24/2015   TIBC 282 09/24/2015    Attestation Statements:   Reviewed by clinician on day of visit: allergies,  medications, problem list, medical history, surgical history, family history, social history, and previous encounter notes.  I, Dawn Whitmire, FNP-C, am acting as transcriptionist for Dr. Jearld Lesch.  I have reviewed the above documentation for accuracy and completeness, and I agree with the above. Jearld Lesch, DO

## 2022-05-23 ENCOUNTER — Encounter: Payer: Self-pay | Admitting: Bariatrics

## 2022-05-23 ENCOUNTER — Ambulatory Visit (INDEPENDENT_AMBULATORY_CARE_PROVIDER_SITE_OTHER): Payer: BC Managed Care – PPO | Admitting: Bariatrics

## 2022-05-23 VITALS — BP 118/72 | HR 72 | Temp 97.5°F | Ht 70.0 in | Wt 233.0 lb

## 2022-05-23 DIAGNOSIS — R7303 Prediabetes: Secondary | ICD-10-CM

## 2022-05-23 DIAGNOSIS — Z6833 Body mass index (BMI) 33.0-33.9, adult: Secondary | ICD-10-CM

## 2022-05-23 DIAGNOSIS — E559 Vitamin D deficiency, unspecified: Secondary | ICD-10-CM | POA: Diagnosis not present

## 2022-05-23 DIAGNOSIS — E669 Obesity, unspecified: Secondary | ICD-10-CM

## 2022-05-24 ENCOUNTER — Encounter: Payer: Self-pay | Admitting: Bariatrics

## 2022-05-24 NOTE — Progress Notes (Unsigned)
Chief Complaint:   OBESITY Bradley James is here to discuss his progress with his obesity treatment plan along with follow-up of his obesity related diagnoses. Bradley James is on the Category 4 Plan and states he is following his eating plan approximately 0% of the time. Bradley James states he is at the gym for 60 minutes 5 times per week.  Today's visit was #: 3 Starting weight: 236 lbs Starting date: 04/25/2022 Today's weight: 233 lbs Today's date: 05/23/2022 Total lbs lost to date: 3 Total lbs lost since last in-office visit: 0  Interim History: Bradley James is up 1 lb since his last visit. 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   Subjective:   1. Vitamin D deficiency Bradley James is taking Vitamin D.   2. Pre-diabetes Bradley James is taking metformin, and he denies hypoglycemia.   Assessment/Plan:   1. Vitamin D deficiency We will refill prescription Vitamin D 50,000 IU weekly #5 for 1 month.   2. Pre-diabetes We will refill metformin 500 mg once daily #30 for 1 month.   3. Generalized obesity  4. BMI 33.0-33.9,adult Bradley James is currently in the action stage of change. As such, his goal is to continue with weight loss efforts. He has agreed to the Category 4 Plan + 100 calories.   Meal planning and intentional eating were discussed.   Exercise goals: As is.   Behavioral modification strategies: increasing lean protein intake, decreasing simple carbohydrates, increasing vegetables, increasing water intake, decreasing eating out, no skipping meals, meal planning and cooking strategies, keeping healthy foods in the home, and planning for success.  Bradley James has agreed to follow-up with our clinic in 2 weeks. He was informed of the importance of frequent follow-up visits to maximize his success with intensive lifestyle modifications for his multiple health conditions.   Objective:   Blood pressure 118/72, pulse 72, temperature (!) 97.5 F (36.4 C), height 5\' 10"  (1.778 m), weight 233 lb (105.7 kg),  SpO2 99 %. Body mass index is 33.43 kg/m.  General: Cooperative, alert, well developed, in no acute distress. HEENT: Conjunctivae and lids unremarkable. Cardiovascular: Regular rhythm.  Lungs: Normal work of breathing. Neurologic: No focal deficits.   Lab Results  Component Value Date   CREATININE 1.04 04/25/2022   BUN 15 04/25/2022   NA 140 04/25/2022   K 4.3 04/25/2022   CL 102 04/25/2022   CO2 24 04/25/2022   Lab Results  Component Value Date   ALT 53 (H) 04/25/2022   AST 39 04/25/2022   ALKPHOS 55 04/25/2022   BILITOT 0.3 04/25/2022   Lab Results  Component Value Date   HGBA1C 5.9 (H) 04/25/2022   Lab Results  Component Value Date   INSULIN 22.0 04/25/2022   Lab Results  Component Value Date   TSH 4.96 03/10/2022   Lab Results  Component Value Date   CHOL 187 03/10/2022   HDL 49.70 03/10/2022   LDLCALC 116 (H) 03/10/2022   LDLDIRECT 141.6 12/24/2006   TRIG 107.0 03/10/2022   CHOLHDL 4 03/10/2022   Lab Results  Component Value Date   VD25OH 22.3 (L) 04/25/2022   Lab Results  Component Value Date   WBC 6.1 03/10/2022   HGB 15.6 03/10/2022   HCT 47.0 03/10/2022   MCV 88.5 03/10/2022   PLT 153.0 03/10/2022   Lab Results  Component Value Date   IRON 64 09/24/2015   TIBC 282 09/24/2015   Attestation Statements:   Reviewed by clinician on day of visit:  allergies, medications, problem list, medical history, surgical history, family history, social history, and previous encounter notes.   Wilhemena Durie, am acting as Location manager for [employer], DO.  I have reviewed the above documentation for accuracy and completeness, and I agree with the above. Jearld Lesch, DO

## 2022-05-25 ENCOUNTER — Encounter: Payer: Self-pay | Admitting: Bariatrics

## 2022-05-25 MED ORDER — METFORMIN HCL 500 MG PO TABS: 500.0000 mg | ORAL_TABLET | Freq: Every day | ORAL | 0 refills | Status: DC

## 2022-05-25 MED ORDER — VITAMIN D (ERGOCALCIFEROL) 1.25 MG (50000 UNIT) PO CAPS: 50000.0000 [IU] | ORAL_CAPSULE | ORAL | 0 refills | Status: DC

## 2022-06-06 ENCOUNTER — Ambulatory Visit (INDEPENDENT_AMBULATORY_CARE_PROVIDER_SITE_OTHER): Payer: BC Managed Care – PPO | Admitting: Nurse Practitioner

## 2022-06-06 ENCOUNTER — Encounter: Payer: Self-pay | Admitting: Nurse Practitioner

## 2022-06-06 VITALS — BP 110/69 | HR 73 | Temp 98.1°F | Ht 70.0 in | Wt 229.0 lb

## 2022-06-06 DIAGNOSIS — E669 Obesity, unspecified: Secondary | ICD-10-CM

## 2022-06-06 DIAGNOSIS — R7303 Prediabetes: Secondary | ICD-10-CM | POA: Diagnosis not present

## 2022-06-06 DIAGNOSIS — E559 Vitamin D deficiency, unspecified: Secondary | ICD-10-CM | POA: Diagnosis not present

## 2022-06-06 DIAGNOSIS — R400 Somnolence: Secondary | ICD-10-CM

## 2022-06-06 DIAGNOSIS — Z6832 Body mass index (BMI) 32.0-32.9, adult: Secondary | ICD-10-CM

## 2022-06-06 DIAGNOSIS — R0683 Snoring: Secondary | ICD-10-CM

## 2022-06-06 MED ORDER — METFORMIN HCL 500 MG PO TABS
500.0000 mg | ORAL_TABLET | Freq: Two times a day (BID) | ORAL | 0 refills | Status: DC
Start: 2022-06-06 — End: 2022-07-04

## 2022-06-06 MED ORDER — VITAMIN D (ERGOCALCIFEROL) 1.25 MG (50000 UNIT) PO CAPS: 50000.0000 [IU] | ORAL_CAPSULE | ORAL | 0 refills | Status: DC

## 2022-06-06 NOTE — Progress Notes (Signed)
Office: [phone]  /  Fax: [phone]  WEIGHT SUMMARY AND BIOMETRICS  Weight Lost Since Last Visit: 4lb  No data recorded  Vitals Temp: 98.1 F (36.7 C) BP: 110/69 Pulse Rate: 73 SpO2: 98 %   Anthropometric Measurements Height: 5\' 10"  (1.778 m) Weight: 229 lb (103.9 kg) BMI (Calculated): 32.86 Weight at Last Visit: 233lb Weight Lost Since Last Visit: 4lb Starting Weight: 236lb Total Weight Loss (lbs): 7 lb (3.175 kg)   Body Composition  Body Fat %: 25.4 % Fat Mass (lbs): 58.4 lbs Muscle Mass (lbs): 162.6 lbs Total Body Water (lbs): 110.6 lbs Visceral Fat Rating : 12   Other Clinical Data Fasting: No Labs: No Today's Visit #: 4 Starting Date: 04/25/22     HPI  Chief Complaint: OBESITY  Bradley James is here to discuss his progress with his obesity treatment plan. He is on the the Category 4 Plan and states he is following his eating plan approximately 0 % of the time. He states he is exercising 60 minutes 2 days per week.   Interval History:  Since last office visit he has lost 4 pounds. 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 He is staying with his mother.   He tends to struggle with snacking and cravings. He is drinking water, G2, coke zero and juice.  His highest weight was 248 lbs.     Pharmacotherapy for weight loss: He is not currently taking medications  for medical weight loss.  Denies side effects.    Bariatric surgery:  Patient never had bariatric surgery.    Vit D deficiency  He is taking Vit D 50,000 IU weekly.  Denies side effects.  Denies nausea, vomiting or muscle weakness.    Lab Results  Component Value Date   VD25OH 22.3 (L) 04/25/2022    Prediabetes Last A1c was 5.9  Medication(s): Metformin 500mg .  Denies side effects.  Has helped with cravings but not throughout the day.  Still reports some polyphagia and cravings.    Lab Results  Component  Value Date   HGBA1C 5.9 (H) 04/25/2022   Lab Results  Component Value Date   INSULIN 22.0 04/25/2022    PHYSICAL EXAM:  Blood pressure 110/69, pulse 73, temperature 98.1 F (36.7 C), height 5\' 10"  (1.778 m), weight 229 lb (103.9 kg), SpO2 98 %. Body mass index is 32.86 kg/m.  General: He is overweight, cooperative, alert, well developed, and in no acute distress. PSYCH: Has normal mood, affect and thought process.   Extremities: No edema.  Neurologic: No gross sensory or motor deficits. No tremors or fasciculations noted.    DIAGNOSTIC DATA REVIEWED:  BMET    Component Value Date/Time   NA 140 04/25/2022 1530   K 4.3 04/25/2022 1530   CL 102 04/25/2022 1530   CO2 24 04/25/2022 1530   GLUCOSE 83 04/25/2022 1530   GLUCOSE 85 03/10/2022 0823   BUN 15 04/25/2022 1530   CREATININE 1.04 04/25/2022 1530   CREATININE 1.27 12/12/2019 0832   CALCIUM 9.5 04/25/2022 1530   GFRNONAA 71.66 02/17/2010 0902   GFRAA 93 12/24/2006 0927   Lab Results  Component Value Date   HGBA1C 5.9 (H) 04/25/2022   Lab Results  Component Value Date   INSULIN 22.0 04/25/2022   Lab Results  Component Value Date   TSH 4.96 03/10/2022   CBC    Component Value Date/Time   WBC 6.1 03/10/2022  0823   RBC 5.31 03/10/2022 0823   HGB 15.6 03/10/2022 0823   HCT 47.0 03/10/2022 0823   PLT 153.0 03/10/2022 0823   MCV 88.5 03/10/2022 0823   MCH 29.5 12/12/2019 0832   MCHC 33.2 03/10/2022 0823   RDW 14.8 03/10/2022 0823   Iron Studies    Component Value Date/Time   IRON 64 09/24/2015 1518   TIBC 282 09/24/2015 1518   IRONPCTSAT 23 09/24/2015 1518   Lipid Panel     Component Value Date/Time   CHOL 187 03/10/2022 0823   TRIG 107.0 03/10/2022 0823   HDL 49.70 03/10/2022 0823   CHOLHDL 4 03/10/2022 0823   VLDL 21.4 03/10/2022 0823   LDLCALC 116 (H) 03/10/2022 0823   LDLCALC 121 (H) 12/12/2019 0832   LDLDIRECT 141.6 12/24/2006 0927   Hepatic Function Panel     Component Value Date/Time    PROT 7.3 04/25/2022 1530   ALBUMIN 4.9 04/25/2022 1530   AST 39 04/25/2022 1530   ALT 53 (H) 04/25/2022 1530   ALKPHOS 55 04/25/2022 1530   BILITOT 0.3 04/25/2022 1530   BILIDIR 0.0 08/08/2012 1043      Component Value Date/Time   TSH 4.96 03/10/2022 0823   Nutritional Lab Results  Component Value Date   VD25OH 22.3 (L) 04/25/2022     ASSESSMENT AND PLAN  TREATMENT PLAN FOR OBESITY:  Recommended Dietary Goals  Bradley James is currently in the action stage of change. As such, his goal is to continue weight management plan. He has agreed to the Category 4 Plan.  Behavioral Intervention  We discussed the following Behavioral Modification Strategies today: increasing lean protein intake, decreasing simple carbohydrates , increasing water intake, and work on meal planning and preparation.  Additional resources provided today: NA  Recommended Physical Activity Goals  Bradley James has been advised to work up to 150 minutes of moderate intensity aerobic activity a week and strengthening exercises 2-3 times per week for cardiovascular health, weight loss maintenance and preservation of muscle mass.   He has agreed to Continue current level of physical activity  and Think about ways to increase physical activity   ASSOCIATED CONDITIONS ADDRESSED TODAY  Action/Plan  Vitamin D deficiency -     Vitamin D (Ergocalciferol); Take 1 capsule (50,000 Units total) by mouth every 7 (seven) days.  Dispense: 5 capsule; Refill: 0.  Side effects discussed.   Low Vitamin D level contributes to fatigue and are associated with obesity, breast, and colon cancer. He agrees to continue to take prescription Vitamin D @50 ,000 IU every week and will follow-up for routine testing of Vitamin D, at least 2-3 times per year to avoid over-replacement.   Pre-diabetes -    Increase metFORMIN HCl; Take 1 tablet (500 mg total) by mouth 2 (two) times daily with a meal.  Dispense: 60 tablet; Refill: 0. Side effects  discussed.    Snoring Refer for evaluation for [facility]  Daytime sleepiness Refer for evaluation for [facility]    Generalized obesity  BMI 32.0-32.9    Return in about 2 weeks (around 06/20/2022).Marland Kitchen He was informed of the importance of frequent follow up visits to maximize his success with intensive lifestyle modifications for his multiple health conditions.   ATTESTASTION STATEMENTS:  Reviewed by clinician on day of visit: allergies, medications, problem list, medical history, surgical history, family history, social history, and previous encounter notes.     Theodis Sato. Sean Malinowski FNP-C

## 2022-06-20 ENCOUNTER — Ambulatory Visit (INDEPENDENT_AMBULATORY_CARE_PROVIDER_SITE_OTHER): Payer: BC Managed Care – PPO | Admitting: Nurse Practitioner

## 2022-06-20 ENCOUNTER — Encounter: Payer: Self-pay | Admitting: Nurse Practitioner

## 2022-06-20 VITALS — BP 119/72 | HR 71 | Temp 98.1°F | Ht 70.0 in | Wt 229.0 lb

## 2022-06-20 DIAGNOSIS — E669 Obesity, unspecified: Secondary | ICD-10-CM | POA: Diagnosis not present

## 2022-06-20 DIAGNOSIS — E559 Vitamin D deficiency, unspecified: Secondary | ICD-10-CM

## 2022-06-20 DIAGNOSIS — R7303 Prediabetes: Secondary | ICD-10-CM | POA: Diagnosis not present

## 2022-06-20 DIAGNOSIS — Z6832 Body mass index (BMI) 32.0-32.9, adult: Secondary | ICD-10-CM | POA: Diagnosis not present

## 2022-06-20 MED ORDER — CONTRAVE 8-90 MG PO TB12: ORAL_TABLET | ORAL | 0 refills | Status: DC

## 2022-06-20 MED ORDER — VITAMIN D (ERGOCALCIFEROL) 1.25 MG (50000 UNIT) PO CAPS
50000.0000 [IU] | ORAL_CAPSULE | ORAL | 0 refills | Status: DC
Start: 2022-06-20 — End: 2022-07-04

## 2022-06-20 NOTE — Patient Instructions (Signed)
What is a GLP-1 Glucagon like peptide-1 (GLP-1) agonists represent a class of medications used to treat type 2 diabetes mellitus and obesity.  GLP-1 medications mimic the action of a hormone called glucagon like peptide 1.  When blood sugar levels start to rise/increase these drugs stimulate the body to produce more insulin.  When that happens, the extra insulin helps to lower the blood sugar levels in the body.  This in returns helps with decreasing cravings.  These medications also slow the movement of food from the stomach into the small intestine.  This in return helps one to full faster and longer.   Diabetic medications: Approved for treatment of diabetes mellitus but does not have full approval for weight loss use Victoza (liraglutide) Ozempic (semaglutide) Mounjaro Trulicity Rybelsus  Weight loss medications: Approved for long-term weight loss use.        Saxenda (liraglutide) Wegovy (semaglutide) Zepbound  Contraindications:  Pancreatitis (active gallstones) Medullary thyroid cancer High triglycerides (>500)-will need labs prior to starting Multiple Endocrine Neoplasia syndrome type 2 (MEN 2) Trying to get pregnant Breastfeeding Use with caution with taking insulin or sulfonylureas (will need to monitor blood sugars for hypoglycemia) Side effects (most common): Most common side effects are nausea, gas, bloating and constipation.  Other possible side effects are headaches, belching, diarrhea, tiredness (fatigue), vomiting, upset stomach, dizziness, heartburn and stomach (abdominal pain).  If you think that you are becoming dehydrated, please inform our office or your primary family provider.  Stop immediately and go to ER if you have any symptoms of a serious allergic reaction including swelling of your face, lips, tongue or throat; problems breathing or swallowing; severe rash or itching; fainting or feeling dizzy; or very rapid heart rate.         Steps to starting your start  Contrave  The office will send a prior authorization request to your insurance company for approval. We will send you a mychart message once we hear back from your insurance with a decision.  This can take up to 7-10 business days.   Is Contrave an option for me? Do not take Contrave if you:   Have uncontrolled hypertension  Have or have had seizures  Use other medicines that contain bupropion such as Wellbutrin, Wellbutrin SR, Wellbutrin XL, and Aplenzin.  Have or have had an eating disorder called anorexia (eating very little) or bulimia (eating too much and vomiting to avoid gaining weight)  Are dependent on opioid pain medicines or use medicines to help stop taking opioids such as methadone or buprenorphine, or are in opiate withdrawal  Drink a lot of alcohol and abruptly stop drinking, or use medicines called sedatives (these make you sleepy), benzodiazepines, or anti-seizure medicines and you stop using them all of a sudden  Are taking medicines called monoamine oxidase inhibitors (MAOIs). Ask your healthcare provider or pharmacist if you are not sure if you take an MAOI, including linezoid. Do not start Contrave until you have stopped taking your MAOI for at least 14 days.  Are allergic to naltrexone HCl or bupropion HCl or any of the ingredients in Contrave. See the end of this Medication Guide for a complete list of ingredients in Contrave.  Are pregnant or planning to become pregnant. Tell your healthcare provider right away if you become pregnant while taking CONTRAVE  What is Contrave and how does it work?  Contrave is a prescription only medicine to help with your weight loss. It works on an area of the brain that   controls your appetite.  It is a combination of two medicines that are extended release: naltrexone HCL and bupropion HCL.  One of the ingredients in Contrave, bupropion, is the same ingredient in some other medicines used to treat depression and to help people  quit smoking. However, Contrave is not approved to treat depression or other mental illnesses, or to help people quit smoking (smoking cessation).   This medicine will be most effective when combined with a reduced calorie diet and physical activity.  How should I take Contrave?  Take exactly as your provider tells you to. It is an increasing dose according to the following chart:  Contrave How should I take Contrave?   It is best to take Contrave with food. However, do not take with high-fat meals.   Swallow the extended-release tablet whole. Do not crush, break, or chew it.   If you miss a dose, skip the missed dose and go back to your regular dosing schedule. Do not take extra medicine to make up for a missed dose.  Do not take more than 2 tablets in the morning and 2 tablets in the evening.  Do not take more than 2 tablets at the same time or more than 4 tablets in 1 day.  Do not stop taking Contrave without talking to your provider.   Stopping Contrave suddenly can cause serious side effects, such as seizures.  Swallow Contrave tablets whole. Do not cut, chew, or crush tablets.  Do not take Contrave when eating a high-fat meal. It may increase your risk of seizures.   What should I avoid while taking Contrave?  You should avoid other medicines that contain bupropion such as Wellbutrin, Wellbutrin SR, Wellbutrin XL and Aplenzin.  You should avoid opioid pain medications or medicines to help stop taking opioids such as methadone or buprenorphine. There may be a risk of opioid overdose.  Do not drink alcohol while taking Contrave. If you drink alcohol, talk to your provider before starting Contrave.   Avoid eating a high-fat meal at the same time you take Contrave. It may increase your risk of seizures.   Women who can become pregnant: Use effective birth control (contraception) consistently while taking Contrave. If you miss a menstrual period, STOP Contrave and call our  office immediately. Monthly pregnancy tests will be performed at your appointment if indicated.  What side effects may I notice when taking Contrave?  Side effects that usually do not require medical attention (report to our office if they continue or are bothersome): o Nausea o Constipation (you may take over the counter laxative if needed) o Headache o Dry mouth (drink at least 64 oz. of fluid daily) o Trouble sleeping (insomnia) o Vomiting o Dizziness o Diarrhea   Side effects that you should report to our office as soon as possible: o Seizures: DO NOT take Contrave again o Depression or severe changes in mood o Thoughts about suicide or dying o Feeling anxious, agitated, irritable, restless, or nervous o Slowed breathing and/or shallow breathing o Severe drowsiness o Confusion o Signs of allergic reaction such as rash, itching, hives, fever, swollen lymph glands, painful sores in your mouth or around your eyes, swelling of your lips or tongue, chest pain, or trouble breathing o Increased blood pressure o Increased heart rate or palpitations o Stomach pain lasting more than a few days o Dark urine o Yellowing of the whites of your eyes o Severe tiredness o Sudden changes in vision o Swelling or redness   in or around the eye  o Low blood sugar in Type 2 Diabetes.   Other important information  While taking CONTRAVE, you or your family members should pay close attention to any changes, especially sudden changes, in mood, behaviors, thoughts, or feelings and maintain communication with your provider. You will be asked to sign an informed consent prior to starting Contrave.  If another healthcare provider prescribes narcotic pain medication for you, please call our office immediately for advice regarding Contrave.  Your prescription will be sent electronically to your pharmacy.   Refills will require an office visit                                                                                      

## 2022-06-20 NOTE — Progress Notes (Signed)
Office: [phone]  /  Fax: [phone]  WEIGHT SUMMARY AND BIOMETRICS  Weight Lost Since Last Visit: 0lb  Weight Gained Since Last Visit: 0lb   Vitals Temp: 98.1 F (36.7 C) BP: 119/72 Pulse Rate: 71 SpO2: 97 %   Anthropometric Measurements Height:  (1.778 m) Weight: 229 lb (103.9 kg) BMI (Calculated): 32.86 Weight at Last Visit: 229lb Weight Lost Since Last Visit: 0lb Weight Gained Since Last Visit: 0lb Starting Weight: 236lb Total Weight Loss (lbs): 7 lb (3.175 kg)   Body Composition  Body Fat %: 25.4 % Fat Mass (lbs): 58.2 lbs Muscle Mass (lbs): 162.6 lbs Total Body Water (lbs): 111.4 lbs Visceral Fat Rating : 12   Other Clinical Data Fasting: No Today's Visit #: 5 Starting Date: 04/25/22     HPI  Chief Complaint: OBESITY  Bradley James is here to discuss his progress with his obesity treatment plan. He is on the the Category 4 Plan and states he is following his eating plan approximately 50 % of the time. He states he is exercising 0 minutes 0 days per week.   Interval History:  Since last office visit he has maintained his weight.  He is not skipping meals.  He is aiming to eat protein with each meal. He is drinking water, juice, coke zero daily. Notes some stress eating.  Notes craving and polyphagia.     Pharmacotherapy for weight loss: He is not currently taking medications  for medical weight loss.   Previous pharmacotherapy for medical weight loss:  none  Bariatric surgery:  Patient has not had bariatric surgery  Prediabetes Last A1c was 5.9  Medication(s): Metformin   Denies side effects.    Lab Results  Component Value Date   HGBA1C 5.9 (H) 04/25/2022   Lab Results  Component Value Date   INSULIN 22.0 04/25/2022   Vit D deficiency  He took one of his wife's  Vit D 50,000 IU.  Denies side effects.  Denies nausea, vomiting or muscle weakness. Hasn't pick up his prescription yet for Vit d but plans to.    Lab Results   Component Value Date   VD25OH 22.3 (L) 04/25/2022     PHYSICAL EXAM:  Blood pressure 119/72, pulse 71, temperature 98.1 F (36.7 C), height  (1.778 m), weight 229 lb (103.9 kg), SpO2 97 %. Body mass index is 32.86 kg/m.  General: He is overweight, cooperative, alert, well developed, and in no acute distress. PSYCH: Has normal mood, affect and thought process.   Extremities: No edema.  Neurologic: No gross sensory or motor deficits. No tremors or fasciculations noted.    DIAGNOSTIC DATA REVIEWED:  BMET    Component Value Date/Time   NA 140 04/25/2022 1530   K 4.3 04/25/2022 1530   CL 102 04/25/2022 1530   CO2 24 04/25/2022 1530   GLUCOSE 83 04/25/2022 1530   GLUCOSE 85 03/10/2022 0823   BUN 15 04/25/2022 1530   CREATININE 1.04 04/25/2022 1530   CREATININE 1.27 12/12/2019 0832   CALCIUM 9.5 04/25/2022 1530   GFRNONAA 71.66 02/17/2010 0902   GFRAA 93 12/24/2006 0927   Lab Results  Component Value Date   HGBA1C 5.9 (H) 04/25/2022   Lab Results  Component Value Date   INSULIN 22.0 04/25/2022   Lab Results  Component Value Date   TSH 4.96 03/10/2022   CBC    Component Value Date/Time   WBC 6.1 03/10/2022 0823   RBC 5.31 03/10/2022 0823   HGB 15.6 03/10/2022  0823   HCT 47.0 03/10/2022 0823   PLT 153.0 03/10/2022 0823   MCV 88.5 03/10/2022 0823   MCH 29.5 12/12/2019 0832   MCHC 33.2 03/10/2022 0823   RDW 14.8 03/10/2022 0823   Iron Studies    Component Value Date/Time   IRON 64 09/24/2015 1518   TIBC 282 09/24/2015 1518   IRONPCTSAT 23 09/24/2015 1518   Lipid Panel     Component Value Date/Time   CHOL 187 03/10/2022 0823   TRIG 107.0 03/10/2022 0823   HDL 49.70 03/10/2022 0823   CHOLHDL 4 03/10/2022 0823   VLDL 21.4 03/10/2022 0823   LDLCALC 116 (H) 03/10/2022 0823   LDLCALC 121 (H) 12/12/2019 0832   LDLDIRECT 141.6 12/24/2006 0927   Hepatic Function Panel     Component Value Date/Time   PROT 7.3 04/25/2022 1530   ALBUMIN 4.9  04/25/2022 1530   AST 39 04/25/2022 1530   ALT 53 (H) 04/25/2022 1530   ALKPHOS 55 04/25/2022 1530   BILITOT 0.3 04/25/2022 1530   BILIDIR 0.0 08/08/2012 1043      Component Value Date/Time   TSH 4.96 03/10/2022 0823   Nutritional Lab Results  Component Value Date   VD25OH 22.3 (L) 04/25/2022     ASSESSMENT AND PLAN  TREATMENT PLAN FOR OBESITY:  Recommended Dietary Goals  Bradley James is currently in the action stage of change. As such, his goal is to continue weight management plan. He has agreed to the Category 4 Plan.  Behavioral Intervention  We discussed the following Behavioral Modification Strategies today: increasing lean protein intake, decreasing simple carbohydrates , increasing vegetables, increasing lower glycemic fruits, increasing fiber rich foods, avoiding skipping meals, increasing water intake, continue to practice mindfulness when eating, and planning for success.  Additional resources provided today: NA  Recommended Physical Activity Goals  Bradley James has been advised to work up to 150 minutes of moderate intensity aerobic activity a week and strengthening exercises 2-3 times per week for cardiovascular health, weight loss maintenance and preservation of muscle mass.   He has agreed to Continue current level of physical activity    Pharmacotherapy We discussed various medication options to help Bradley James with his weight loss efforts and we both agreed to start Contrave.  Side effects discussed.  Discussing avoiding alcohol and narcotics while taking.  ASSOCIATED CONDITIONS ADDRESSED TODAY  Action/Plan  Pre-diabetes Continue Metformin  Bradley James will continue to work on weight loss, exercise, and decreasing simple carbohydrates to help decrease the risk of diabetes.    Vitamin D deficiency -     Vitamin D (Ergocalciferol); Take 1 capsule (50,000 Units total) by mouth every 7 (seven) days.  Dispense: 5 capsule; Refill: 0  Low Vitamin D level contributes to fatigue and  are associated with obesity, breast, and colon cancer. He agrees to continue to take prescription Vitamin D ,000 IU every week and will follow-up for routine testing of Vitamin D, at least 2-3 times per year to avoid over-replacement.   Generalized obesity -     Contrave; Start 1 tablet every morning for 7 days, then 1 tablet twice daily for 7 days, then 2 tablets every morning and one every evening for 7 days and then 2 tablets po BID  Dispense: 120 tablet; Refill: 0  BMI 32.0-32.9,adult -     Contrave; Start 1 tablet every morning for 7 days, then 1 tablet twice daily for 7 days, then 2 tablets every morning and one every evening for 7 days and then 2 tablets  po BID  Dispense: 120 tablet; Refill: 0         Return in about 3 weeks (around 07/11/2022).Marland Kitchen He was informed of the importance of frequent follow up visits to maximize his success with intensive lifestyle modifications for his multiple health conditions.   ATTESTASTION STATEMENTS:  Reviewed by clinician on day of visit: allergies, medications, problem list, medical history, surgical history, family history, social history, and previous encounter notes.    Theodis Sato. Lakashia Collison FNP-C

## 2022-06-21 ENCOUNTER — Telehealth: Payer: Self-pay

## 2022-06-21 NOTE — Telephone Encounter (Signed)
PA submitted through Cover My Meds for Contrave. Awaiting insurance determination. Key: [id number]

## 2022-06-26 NOTE — Telephone Encounter (Signed)
Mychart message sent to patient.

## 2022-06-26 NOTE — Telephone Encounter (Signed)
Received through Cover My Meds that Contrave has been denied.

## 2022-07-04 ENCOUNTER — Ambulatory Visit (INDEPENDENT_AMBULATORY_CARE_PROVIDER_SITE_OTHER): Payer: BC Managed Care – PPO | Admitting: Nurse Practitioner

## 2022-07-04 ENCOUNTER — Encounter: Payer: Self-pay | Admitting: Nurse Practitioner

## 2022-07-04 VITALS — BP 117/76 | HR 75 | Temp 98.1°F | Ht 70.0 in | Wt 225.0 lb

## 2022-07-04 DIAGNOSIS — E559 Vitamin D deficiency, unspecified: Secondary | ICD-10-CM | POA: Diagnosis not present

## 2022-07-04 DIAGNOSIS — Z6832 Body mass index (BMI) 32.0-32.9, adult: Secondary | ICD-10-CM

## 2022-07-04 DIAGNOSIS — R7303 Prediabetes: Secondary | ICD-10-CM | POA: Diagnosis not present

## 2022-07-04 DIAGNOSIS — E669 Obesity, unspecified: Secondary | ICD-10-CM

## 2022-07-04 MED ORDER — VITAMIN D (ERGOCALCIFEROL) 1.25 MG (50000 UNIT) PO CAPS: 50000.0000 [IU] | ORAL_CAPSULE | ORAL | 0 refills | Status: DC

## 2022-07-04 MED ORDER — CONTRAVE 8-90 MG PO TB12
ORAL_TABLET | ORAL | 0 refills | Status: DC
Start: 2022-07-04 — End: 2022-09-12

## 2022-07-04 NOTE — Patient Instructions (Signed)

## 2022-07-04 NOTE — Progress Notes (Signed)
Office: [phone]  /  Fax: [phone]  WEIGHT SUMMARY AND BIOMETRICS  Weight Lost Since Last Visit: 4lb  No data recorded  Vitals Temp: 98.1 F (36.7 C) BP: 117/76 Pulse Rate: 75 SpO2: 99 %   Anthropometric Measurements Height: 5\' 10"  (1.778 m) Weight: 225 lb (102.1 kg) BMI (Calculated): 32.28 Weight at Last Visit: 229lb Weight Lost Since Last Visit: 4lb Starting Weight: 236lb Total Weight Loss (lbs): 11 lb (4.99 kg)   Body Composition  Body Fat %: 25.1 % Fat Mass (lbs): 58.6 lbs Muscle Mass (lbs): 160.6 lbs Total Body Water (lbs): 109 lbs Visceral Fat Rating : 12   Other Clinical Data Fasting: No Labs: No Today's Visit #: 6 Starting Date: 04/25/22     HPI  Chief Complaint: OBESITY  Bradley James is here to discuss his progress with his obesity treatment plan. He is on the the Category 4 Plan and states he is following his eating plan approximately 0 % of the time. He states he is exercising 60 minutes 1-2 days per week.   Interval History:  Since last office visit he has lost 4 pounds.  He is not skipping meals.  He is trying to watch his portion sizes and is eating a protein with each meal.  He is working on drinking more water.  He also drinks juice, coffee and coke zero.     Pharmacotherapy for weight loss: He is not currently taking Contrave due to cost.      Previous pharmacotherapy for medical weight loss:  Has not taken meds for medical weight loss.   Bariatric surgery:  Patient has not had bariatric surgery.      Prediabetes Last A1c was 5.9 and insulin was 22.0  Medication(s): He was taking Metformin and stopped 1 week ago.  Denied side effects but doesn't want to take the Metformin.   Reports polyphagia, denies cravings.    Lab Results  Component Value Date   HGBA1C 5.9 (H) 04/25/2022   Lab Results  Component Value Date   INSULIN 22.0 04/25/2022   Vit D deficiency  He is taking Vit D 50,000 IU weekly.  Denies side effects.  Denies  nausea, vomiting or muscle weakness.    Lab Results  Component Value Date   VD25OH 22.3 (L) 04/25/2022    PHYSICAL EXAM:  Blood pressure 117/76, pulse 75, temperature 98.1 F (36.7 C), height 5\' 10"  (1.778 m), weight 225 lb (102.1 kg), SpO2 99 %. Body mass index is 32.28 kg/m.  General: He is overweight, cooperative, alert, well developed, and in no acute distress. PSYCH: Has normal mood, affect and thought process.   Extremities: No edema.  Neurologic: No gross sensory or motor deficits. No tremors or fasciculations noted.    DIAGNOSTIC DATA REVIEWED:  BMET    Component Value Date/Time   NA 140 04/25/2022 1530   K 4.3 04/25/2022 1530   CL 102 04/25/2022 1530   CO2 24 04/25/2022 1530   GLUCOSE 83 04/25/2022 1530   GLUCOSE 85 03/10/2022 0823   BUN 15 04/25/2022 1530   CREATININE 1.04 04/25/2022 1530   CREATININE 1.27 12/12/2019 0832   CALCIUM 9.5 04/25/2022 1530   GFRNONAA 71.66 02/17/2010 0902   GFRAA 93 12/24/2006 0927   Lab Results  Component Value Date   HGBA1C 5.9 (H) 04/25/2022   Lab Results  Component Value Date   INSULIN 22.0 04/25/2022   Lab Results  Component Value Date   TSH 4.96 03/10/2022   CBC  Component Value Date/Time   WBC 6.1 03/10/2022 0823   RBC 5.31 03/10/2022 0823   HGB 15.6 03/10/2022 0823   HCT 47.0 03/10/2022 0823   PLT 153.0 03/10/2022 0823   MCV 88.5 03/10/2022 0823   MCH 29.5 12/12/2019 0832   MCHC 33.2 03/10/2022 0823   RDW 14.8 03/10/2022 0823   Iron Studies    Component Value Date/Time   IRON 64 09/24/2015 1518   TIBC 282 09/24/2015 1518   IRONPCTSAT 23 09/24/2015 1518   Lipid Panel     Component Value Date/Time   CHOL 187 03/10/2022 0823   TRIG 107.0 03/10/2022 0823   HDL 49.70 03/10/2022 0823   CHOLHDL 4 03/10/2022 0823   VLDL 21.4 03/10/2022 0823   LDLCALC 116 (H) 03/10/2022 0823   LDLCALC 121 (H) 12/12/2019 0832   LDLDIRECT 141.6 12/24/2006 0927   Hepatic Function Panel     Component Value Date/Time    PROT 7.3 04/25/2022 1530   ALBUMIN 4.9 04/25/2022 1530   AST 39 04/25/2022 1530   ALT 53 (H) 04/25/2022 1530   ALKPHOS 55 04/25/2022 1530   BILITOT 0.3 04/25/2022 1530   BILIDIR 0.0 08/08/2012 1043      Component Value Date/Time   TSH 4.96 03/10/2022 0823   Nutritional Lab Results  Component Value Date   VD25OH 22.3 (L) 04/25/2022     ASSESSMENT AND PLAN  TREATMENT PLAN FOR OBESITY:  Recommended Dietary Goals  Bradley James is currently in the action stage of change. As such, his goal is to continue weight management plan. He has agreed to the Category 4 Plan.  Behavioral Intervention  We discussed the following Behavioral Modification Strategies today: increasing lean protein intake, decreasing simple carbohydrates , increasing vegetables, increasing lower glycemic fruits, increasing fiber rich foods, avoiding skipping meals, increasing water intake, continue to practice mindfulness when eating, and planning for success.  Additional resources provided today: NA  Recommended Physical Activity Goals  Bradley James has been advised to work up to 150 minutes of moderate intensity aerobic activity a week and strengthening exercises 2-3 times per week for cardiovascular health, weight loss maintenance and preservation of muscle mass.   He has agreed to Continue current level of physical activity  and Think about ways to increase physical activity   Pharmacotherapy We discussed various medication options to help Bradley James with his weight loss efforts and we both agreed to start Contrave.  Plans to take 1 po daily for 7 days and then take one po BID until his next visit.  Side effects discussed.   ASSOCIATED CONDITIONS ADDRESSED TODAY  Action/Plan  Pre-diabetes Patient doesn't want to restart Metformin at this time.    Bradley James will continue to work on weight loss, exercise, and decreasing simple carbohydrates to help decrease the risk of diabetes.    Vitamin D deficiency -     Vitamin D  (Ergocalciferol); Take 1 capsule (50,000 Units total) by mouth every 7 (seven) days.  Dispense: 5 capsule; Refill: 0  Low Vitamin D level contributes to fatigue and are associated with obesity, breast, and colon cancer. He agrees to continue to take prescription Vitamin D @50 ,000 IU every week and will follow-up for routine testing of Vitamin D, at least 2-3 times per year to avoid over-replacement.   BMI 32.0-32.9,adult -     start Contrave; Start 1 tablet every morning for 7 days, then 1 tablet twice daily for 7 days, then 2 tablets every morning and one every evening for 7 days and then  2 tablets po BID  Dispense: 120 tablet; Refill: 0. Side effects discussed.   Generalized obesity -     Contrave; Start 1 tablet every morning for 7 days, then 1 tablet twice daily for 7 days, then 2 tablets every morning and one every evening for 7 days and then 2 tablets po BID  Dispense: 120 tablet; Refill: 0     Will obtain labs at next visit;  A1c, Vit D, CMP    Return in about 4 weeks (around 08/01/2022).Marland Kitchen He was informed of the importance of frequent follow up visits to maximize his success with intensive lifestyle modifications for his multiple health conditions.   ATTESTASTION STATEMENTS:  Reviewed by clinician on day of visit: allergies, medications, problem list, medical history, surgical history, family history, social history, and previous encounter notes.    Theodis Sato. Natalyn Szymanowski FNP-C

## 2022-08-09 ENCOUNTER — Ambulatory Visit: Payer: BC Managed Care – PPO | Admitting: Nurse Practitioner

## 2022-08-10 ENCOUNTER — Ambulatory Visit (INDEPENDENT_AMBULATORY_CARE_PROVIDER_SITE_OTHER): Payer: BC Managed Care – PPO | Admitting: Nurse Practitioner

## 2022-08-10 ENCOUNTER — Encounter: Payer: Self-pay | Admitting: Nurse Practitioner

## 2022-08-10 VITALS — BP 120/76 | HR 70 | Temp 97.8°F | Ht 70.0 in | Wt 223.0 lb

## 2022-08-10 DIAGNOSIS — E669 Obesity, unspecified: Secondary | ICD-10-CM | POA: Diagnosis not present

## 2022-08-10 DIAGNOSIS — Z6832 Body mass index (BMI) 32.0-32.9, adult: Secondary | ICD-10-CM

## 2022-08-10 DIAGNOSIS — E559 Vitamin D deficiency, unspecified: Secondary | ICD-10-CM

## 2022-08-10 DIAGNOSIS — R7303 Prediabetes: Secondary | ICD-10-CM | POA: Diagnosis not present

## 2022-08-10 MED ORDER — VITAMIN D (ERGOCALCIFEROL) 1.25 MG (50000 UNIT) PO CAPS: 50000.0000 [IU] | ORAL_CAPSULE | ORAL | 0 refills | Status: DC

## 2022-08-10 NOTE — Progress Notes (Signed)
Office: [phone]  /  Fax: [phone]  WEIGHT SUMMARY AND BIOMETRICS  Weight Lost Since Last Visit: 2lb   Vitals Temp: 97.8 F (36.6 C) BP: 120/76 Pulse Rate: 70 SpO2: 98 %   Anthropometric Measurements Height: 5\' 10"  (1.778 m) Weight: 223 lb (101.2 kg) BMI (Calculated): 32 Weight at Last Visit: 225lb Weight Lost Since Last Visit: 2lb Starting Weight: 236lb Total Weight Loss (lbs): 13 lb (5.897 kg)   Body Composition  Body Fat %: 24.3 % Fat Mass (lbs): 54.2 lbs Muscle Mass (lbs): 160.6 lbs Total Body Water (lbs): 108.6 lbs Visceral Fat Rating : 11   Other Clinical Data Fasting: no Labs: no Today's Visit #: 7 Starting Date: 04/25/22     HPI  Chief Complaint: OBESITY  Aashish is here to discuss his progress with his obesity treatment plan. He is on the the Category 4 Plan and states he is following his eating plan approximately 50 % of the time. He states he is exercising 60 minutes 3-4 days per week.   Interval History:  Since last office visit he has lost 2 pounds.  He is currently on night shift for the next six weeks.  His sleep is off since switching to night shift.  He is not skipping meals.  Struggles with larger portion sizes.  He is drinking diet coke, G2, juice and water.     Pharmacotherapy for weight loss: He is currently taking Contrave 1 po BID for medical weight loss.  Denies side effects.  Helps with hunger and cravings.    Previous pharmacotherapy for medical weight loss:  None  Bariatric surgery:  Has not had bariatric surgery  Vit D deficiency  He is taking Vit D 50,000 IU weekly.  Denies side effects.  Denies nausea, vomiting or muscle weakness.    Lab Results  Component Value Date   VD25OH 22.3 (L) 04/25/2022     PHYSICAL EXAM:  Blood pressure 120/76, pulse 70, temperature 97.8 F (36.6 C), height 5\' 10"  (1.778 m), weight 223 lb (101.2 kg), SpO2 98 %. Body mass index is 32 kg/m.  General: He is overweight, cooperative,  alert, well developed, and in no acute distress. PSYCH: Has normal mood, affect and thought process.   Extremities: No edema.  Neurologic: No gross sensory or motor deficits. No tremors or fasciculations noted.    DIAGNOSTIC DATA REVIEWED:  BMET    Component Value Date/Time   NA 140 04/25/2022 1530   K 4.3 04/25/2022 1530   CL 102 04/25/2022 1530   CO2 24 04/25/2022 1530   GLUCOSE 83 04/25/2022 1530   GLUCOSE 85 03/10/2022 0823   BUN 15 04/25/2022 1530   CREATININE 1.04 04/25/2022 1530   CREATININE 1.27 12/12/2019 0832   CALCIUM 9.5 04/25/2022 1530   GFRNONAA 71.66 02/17/2010 0902   GFRAA 93 12/24/2006 0927   Lab Results  Component Value Date   HGBA1C 5.9 (H) 04/25/2022   Lab Results  Component Value Date   INSULIN 22.0 04/25/2022   Lab Results  Component Value Date   TSH 4.96 03/10/2022   CBC    Component Value Date/Time   WBC 6.1 03/10/2022 0823   RBC 5.31 03/10/2022 0823   HGB 15.6 03/10/2022 0823   HCT 47.0 03/10/2022 0823   PLT 153.0 03/10/2022 0823   MCV 88.5 03/10/2022 0823   MCH 29.5 12/12/2019 0832   MCHC 33.2 03/10/2022 0823   RDW 14.8 03/10/2022 0823   Iron Studies    Component Value Date/Time  IRON 64 09/24/2015 1518   TIBC 282 09/24/2015 1518   IRONPCTSAT 23 09/24/2015 1518   Lipid Panel     Component Value Date/Time   CHOL 187 03/10/2022 0823   TRIG 107.0 03/10/2022 0823   HDL 49.70 03/10/2022 0823   CHOLHDL 4 03/10/2022 0823   VLDL 21.4 03/10/2022 0823   LDLCALC 116 (H) 03/10/2022 0823   LDLCALC 121 (H) 12/12/2019 0832   LDLDIRECT 141.6 12/24/2006 0927   Hepatic Function Panel     Component Value Date/Time   PROT 7.3 04/25/2022 1530   ALBUMIN 4.9 04/25/2022 1530   AST 39 04/25/2022 1530   ALT 53 (H) 04/25/2022 1530   ALKPHOS 55 04/25/2022 1530   BILITOT 0.3 04/25/2022 1530   BILIDIR 0.0 08/08/2012 1043      Component Value Date/Time   TSH 4.96 03/10/2022 0823   Nutritional Lab Results  Component Value Date   VD25OH  22.3 (L) 04/25/2022     ASSESSMENT AND PLAN  TREATMENT PLAN FOR OBESITY:  Recommended Dietary Goals  Taaj is currently in the action stage of change. As such, his goal is to continue weight management plan. He has agreed to the Category 4 Plan.  Behavioral Intervention  We discussed the following Behavioral Modification Strategies today: increasing lean protein intake, decreasing simple carbohydrates , increasing vegetables, increasing lower glycemic fruits, avoiding skipping meals, increasing water intake, work on meal planning and preparation, keeping healthy foods at home, continue to practice mindfulness when eating, and planning for success.  Additional resources provided today: NA  Recommended Physical Activity Goals  Seananthony has been advised to work up to 150 minutes of moderate intensity aerobic activity a week and strengthening exercises 2-3 times per week for cardiovascular health, weight loss maintenance and preservation of muscle mass.   He has agreed to Continue current level of physical activity    Pharmacotherapy We discussed various medication options to help [place] with his weight loss efforts and we both agreed to continue Contrave 1 po BID.  He does not want to increase dose at this time.  ASSOCIATED CONDITIONS ADDRESSED TODAY  Action/Plan  Pre-diabetes -     Hemoglobin A1c -     Comprehensive metabolic panel  Hoyet will continue to work on weight loss, exercise, and decreasing simple carbohydrates to help decrease the risk of diabetes.    Vitamin D deficiency -     Vitamin D (Ergocalciferol); Take 1 capsule (50,000 Units total) by mouth every 7 (seven) days.  Dispense: 5 capsule; Refill: 0 -     VITAMIN D 25 Hydroxy (Vit-D Deficiency, Fractures) -     Comprehensive metabolic panel  BMI 32.0-32.9,adult -     Comprehensive metabolic panel  Generalized obesity -     Comprehensive metabolic panel      Patient reports having lipids recently checked.  Will  send to me via mychart to review   Return in about 4 weeks (around 09/07/2022).Marland Kitchen He was informed of the importance of frequent follow up visits to maximize his success with intensive lifestyle modifications for his multiple health conditions.   ATTESTASTION STATEMENTS:  Reviewed by clinician on day of visit: allergies, medications, problem list, medical history, surgical history, family history, social history, and previous encounter notes.     Theodis Sato. Kanai Berrios FNP-C

## 2022-08-11 LAB — COMPREHENSIVE METABOLIC PANEL
ALT: 29 IU/L (ref 0–44)
AST: 30 IU/L (ref 0–40)
Albumin/Globulin Ratio: 2
Albumin: 4.6 g/dL (ref 4.1–5.1)
Alkaline Phosphatase: 57 IU/L (ref 44–121)
BUN/Creatinine Ratio: 19 (ref 9–20)
BUN: 21 mg/dL (ref 6–24)
Bilirubin Total: 0.5 mg/dL (ref 0.0–1.2)
CO2: 23 mmol/L (ref 20–29)
Calcium: 9.4 mg/dL (ref 8.7–10.2)
Chloride: 104 mmol/L (ref 96–106)
Creatinine, Ser: 1.08 mg/dL (ref 0.76–1.27)
Globulin, Total: 2.3 g/dL (ref 1.5–4.5)
Glucose: 76 mg/dL (ref 70–99)
Potassium: 4.3 mmol/L (ref 3.5–5.2)
Sodium: 141 mmol/L (ref 134–144)
Total Protein: 6.9 g/dL (ref 6.0–8.5)
eGFR: 87 mL/min/{1.73_m2} (ref >59)

## 2022-08-11 LAB — VITAMIN D 25 HYDROXY (VIT D DEFICIENCY, FRACTURES): Vit D, 25-Hydroxy: 39.3 ng/mL (ref 30.0–100.0)

## 2022-08-11 LAB — HEMOGLOBIN A1C
Est. average glucose Bld gHb Est-mCnc: 117 mg/dL
Hgb A1c MFr Bld: 5.7 % — ABNORMAL HIGH (ref 4.8–5.6)

## 2022-09-12 ENCOUNTER — Encounter: Payer: Self-pay | Admitting: Nurse Practitioner

## 2022-09-12 ENCOUNTER — Ambulatory Visit: Payer: BC Managed Care – PPO | Admitting: Internal Medicine

## 2022-09-12 ENCOUNTER — Ambulatory Visit: Payer: BC Managed Care – PPO | Admitting: Nurse Practitioner

## 2022-09-12 ENCOUNTER — Encounter: Payer: Self-pay | Admitting: Internal Medicine

## 2022-09-12 VITALS — BP 105/70 | HR 62 | Temp 97.9°F | Ht 70.0 in | Wt 224.0 lb

## 2022-09-12 VITALS — BP 128/62 | HR 62 | Temp 98.2°F | Resp 16 | Ht 70.0 in | Wt 227.2 lb

## 2022-09-12 DIAGNOSIS — R7303 Prediabetes: Secondary | ICD-10-CM

## 2022-09-12 DIAGNOSIS — R7989 Other specified abnormal findings of blood chemistry: Secondary | ICD-10-CM

## 2022-09-12 DIAGNOSIS — E669 Obesity, unspecified: Secondary | ICD-10-CM

## 2022-09-12 DIAGNOSIS — S8392XA Sprain of unspecified site of left knee, initial encounter: Secondary | ICD-10-CM

## 2022-09-12 DIAGNOSIS — Z6832 Body mass index (BMI) 32.0-32.9, adult: Secondary | ICD-10-CM

## 2022-09-12 DIAGNOSIS — E559 Vitamin D deficiency, unspecified: Secondary | ICD-10-CM

## 2022-09-12 MED ORDER — CONTRAVE 8-90 MG PO TB12
ORAL_TABLET | ORAL | 0 refills | Status: DC
Start: 2022-09-12 — End: 2022-12-18

## 2022-09-12 NOTE — Progress Notes (Unsigned)
   Subjective:    Patient ID: Bradley James, male    DOB: 1980/04/30, 42 y.o.   MRN: [id number]  DOS:  09/12/2022 Type of visit - description: Follow-up  Since the last office visit is doing well. + Weight loss. He is not regularly active. About 2 weeks ago he played  soccer and developed pain on the inner side of the left leg. No hip pain per se.  Wt Readings from Last 3 Encounters:  09/12/22 227 lb 4 oz (103.1 kg)  08/10/22 223 lb (101.2 kg)  07/04/22 225 lb (102.1 kg)     Review of Systems See above   Past Medical History:  Diagnosis Date   Back pain    Elevated LFTs    Hyperlipidemia    Lactose intolerance    Obesity     Past Surgical History:  Procedure Laterality Date   NO PAST SURGERIES      Current Outpatient Medications  Medication Instructions   b complex vitamins capsule 1 capsule, Oral, Daily   MAGNESIUM PO Oral, Daily   Naltrexone-buPROPion HCl ER (CONTRAVE) 8-90 MG TB12 Start 1 tablet every morning for 7 days, then 1 tablet twice daily for 7 days, then 2 tablets every morning and one every evening for 7 days and then 2 tablets po BID   Vitamin D (Ergocalciferol) (DRISDOL) 50,000 Units, Oral, Every 7 days       Objective:   Physical Exam BP 128/62   Pulse 62   Temp 98.2 F (36.8 C) (Oral)   Resp 16   Ht 5\' 10"  (1.778 m)   Wt 227 lb 4 oz (103.1 kg)   SpO2 97%   BMI 32.61 kg/m  General:   Well developed, NAD, BMI noted. HEENT:  Normocephalic . Face symmetric, atraumatic Lungs:  CTA B Normal respiratory effort, no intercostal retractions, no accessory muscle use. Heart: RRR,  no murmur.  Lower extremities: no pretibial edema bilaterally Mild pain when he does a forced adduction of the left leg.  Hip rotation normal Skin: Not pale. Not jaundice Neurologic:  alert & oriented X3.  Speech normal, gait appropriate for age and unassisted Psych--  Cognition and judgment appear intact.  Cooperative with normal attention span and concentration.   Behavior appropriate. No anxious or depressed appearing.      Assessment     ASSESSMENT Hyperglycemia: A1c 5.9 ---- 2024. Large mole, left leg, h/o (-) Bxs before Increase LFTs: Hep B and C negative 08-2014. 08-2015: Normal ceruloplasmin, iron; [place]: Fatty liver Increased TSH Vit  D def  PLAN Recent labs reviewed. Latest LFTs negative, A1c decreased from 5.9-5.7. Last TSH normal Hyperglycemia: Recently diagnosed, doing well with diet. Obesity: Working with the wellness clinic, in January his weight was 245 pounds, currently 227.  On Contrave, having some mild side effects, recommend to reach out to the wellness clinic.  Praised. has lost a significant amount of weight. Increased LFTs, interestingly, LFTs now have normalized w/ weight loss.  Most likely related to fatty liver. Sprain, left inner leg: Mild, recommend stretching before playing sports. Vaccine advice provided RTC 02-2023 CPX

## 2022-09-12 NOTE — Progress Notes (Signed)
Patient saw PCP today.  Visit was rescheduled

## 2022-09-12 NOTE — Patient Instructions (Addendum)
Vaccines I recommend: Covid booster Flu shot this fall      GO TO THE FRONT DESK, PLEASE SCHEDULE YOUR APPOINTMENTS Come back for  a physical by 02-2023

## 2022-09-12 NOTE — Progress Notes (Deleted)
Office: [phone]  /  Fax: [phone]  WEIGHT SUMMARY AND BIOMETRICS  Weight Lost Since Last Visit: 0lb  Weight Gained Since Last Visit: 1lb   Vitals Temp: 97.9 F (36.6 C) BP: 105/70 Pulse Rate: 62 SpO2: 98 %   Anthropometric Measurements Height: 5\' 10"  (1.778 m) Weight: 224 lb (101.6 kg) BMI (Calculated): 32.14 Weight at Last Visit: 223lb Weight Lost Since Last Visit: 0lb Weight Gained Since Last Visit: 1lb Starting Weight: 236lb Total Weight Loss (lbs): 12 lb (5.443 kg)   Body Composition  Body Fat %: 24.5 % Fat Mass (lbs): 55 lbs Muscle Mass (lbs): 161.2 lbs Total Body Water (lbs): 48.7 lbs Visceral Fat Rating : 11   Other Clinical Data Fasting: No Labs: No Today's Visit #: 8 Starting Date: 04/25/22     HPI  Chief Complaint: OBESITY  Bradley James is here to discuss his progress with his obesity treatment plan. He is on the the Category 4 Plan and states he is following his eating plan approximately 60 % of the time. He states he is exercising 60 minutes 3 days per week.   Interval History:  Since last office visit he has gained 1 pound.  Not skipping meals.  He is eating a protein with each meal     Pharmacotherapy for weight loss: He is currently taking Contrave 2 am and 1 pm for medical weight loss.  Denies side effects.  He stopped taking it for 1 week while on vacation and restarted taking again today.   Previous pharmacotherapy for medical weight loss:  None  Bariatric surgery:  Patient has not had bariatric surgery  Vit D deficiency  He is taking Vit D 50,000 IU weekly.  Denies side effects.  Denies nausea, vomiting or muscle weakness.    Lab Results  Component Value Date   VD25OH 39.3 08/10/2022   VD25OH 22.3 (L) 04/25/2022    Prediabetes Last A1c was 5.7-looks better  Medication(s): none Lab Results  Component Value Date   HGBA1C 5.7 (H) 08/10/2022   HGBA1C 5.9 (H) 04/25/2022   Lab Results  Component Value Date   INSULIN  22.0 04/25/2022    Elevated liver enzymes Last labs looked better.  Denies abd pain  PHYSICAL EXAM:  Blood pressure 105/70, pulse 62, temperature 97.9 F (36.6 C), height 5\' 10"  (1.778 m), weight 224 lb (101.6 kg), SpO2 98%. Body mass index is 32.14 kg/m.  General: He is overweight, cooperative, alert, well developed, and in no acute distress. PSYCH: Has normal mood, affect and thought process.   Extremities: No edema.  Neurologic: No gross sensory or motor deficits. No tremors or fasciculations noted.    DIAGNOSTIC DATA REVIEWED:  BMET    Component Value Date/Time   NA 141 08/10/2022 1449   K 4.3 08/10/2022 1449   CL 104 08/10/2022 1449   CO2 23 08/10/2022 1449   GLUCOSE 76 08/10/2022 1449   GLUCOSE 85 03/10/2022 0823   BUN 21 08/10/2022 1449   CREATININE 1.08 08/10/2022 1449   CREATININE 1.27 12/12/2019 0832   CALCIUM 9.4 08/10/2022 1449   GFRNONAA 71.66 02/17/2010 0902   GFRAA 93 12/24/2006 0927   Lab Results  Component Value Date   HGBA1C 5.7 (H) 08/10/2022   HGBA1C 5.9 (H) 04/25/2022   Lab Results  Component Value Date   INSULIN 22.0 04/25/2022   Lab Results  Component Value Date   TSH 4.96 03/10/2022   CBC    Component Value Date/Time   WBC 6.1 03/10/2022 0823  RBC 5.31 03/10/2022 0823   HGB 15.6 03/10/2022 0823   HCT 47.0 03/10/2022 0823   PLT 153.0 03/10/2022 0823   MCV 88.5 03/10/2022 0823   MCH 29.5 12/12/2019 0832   MCHC 33.2 03/10/2022 0823   RDW 14.8 03/10/2022 0823   Iron Studies    Component Value Date/Time   IRON 64 09/24/2015 1518   TIBC 282 09/24/2015 1518   IRONPCTSAT 23 09/24/2015 1518   Lipid Panel     Component Value Date/Time   CHOL 187 03/10/2022 0823   TRIG 107.0 03/10/2022 0823   HDL 49.70 03/10/2022 0823   CHOLHDL 4 03/10/2022 0823   VLDL 21.4 03/10/2022 0823   LDLCALC 116 (H) 03/10/2022 0823   LDLCALC 121 (H) 12/12/2019 0832   LDLDIRECT 141.6 12/24/2006 0927   Hepatic Function Panel     Component Value  Date/Time   PROT 6.9 08/10/2022 1449   ALBUMIN 4.6 08/10/2022 1449   AST 30 08/10/2022 1449   ALT 29 08/10/2022 1449   ALKPHOS 57 08/10/2022 1449   BILITOT 0.5 08/10/2022 1449   BILIDIR 0.0 08/08/2012 1043      Component Value Date/Time   TSH 4.96 03/10/2022 0823   Nutritional Lab Results  Component Value Date   VD25OH 39.3 08/10/2022   VD25OH 22.3 (L) 04/25/2022     ASSESSMENT AND PLAN  TREATMENT PLAN FOR OBESITY:  Recommended Dietary Goals  Bradley James is currently in the action stage of change. As such, his goal is to continue weight management plan. He has agreed to {MWMwtlossportion/plan2:23431}.  Behavioral Intervention  We discussed the following Behavioral Modification Strategies today: {EMWMwtlossstrategies:28914::"increasing lean protein intake","decreasing simple carbohydrates ","increasing vegetables","increasing lower glycemic fruits","increasing water intake","continue to practice mindfulness when eating","planning for success"}.  Additional resources provided today: NA  Recommended Physical Activity Goals  Bradley James has been advised to work up to 150 minutes of moderate intensity aerobic activity a week and strengthening exercises 2-3 times per week for cardiovascular health, weight loss maintenance and preservation of muscle mass.   He has agreed to {EMEXERCISE:28847::"Think about ways to increase daily physical activity and overcoming barriers to exercise"}   Pharmacotherapy We discussed various medication options to help Bradley James with his weight loss efforts and we both agreed to ***.  ASSOCIATED CONDITIONS ADDRESSED TODAY  Action/Plan  There are no diagnoses linked to this encounter.    Labs reviewed in chart with patient from 08/10/22.     No follow-ups on file.Marland Kitchen He was informed of the importance of frequent follow up visits to maximize his success with intensive lifestyle modifications for his multiple health conditions.   ATTESTASTION  STATEMENTS:  Reviewed by clinician on day of visit: allergies, medications, problem list, medical history, surgical history, family history, social history, and previous encounter notes.   Time spent on visit including pre-visit chart review and post-visit care and charting was *** minutes.    Theodis Sato. Rockey Guarino FNP-C

## 2022-09-13 NOTE — Assessment & Plan Note (Signed)
Recent labs reviewed. Latest LFTs negative, A1c decreased from 5.9-5.7. Last TSH normal Hyperglycemia: Recently diagnosed, doing well with diet. Obesity: Working with the wellness clinic, in January his weight was 245 pounds, currently 227.  On Contrave, having some mild side effects, recommend to reach out to the wellness clinic.  Praised. has lost a significant amount of weight. Increased LFTs, interestingly, LFTs now have normalized w/ weight loss.  Most likely related to fatty liver. Sprain, left inner leg: Mild, recommend stretching before playing sports. Vaccine advice provided RTC 02-2023 CPX

## 2022-10-11 ENCOUNTER — Encounter: Payer: Self-pay | Admitting: Nurse Practitioner

## 2022-10-11 ENCOUNTER — Ambulatory Visit: Payer: BC Managed Care – PPO | Admitting: Nurse Practitioner

## 2022-10-11 VITALS — BP 118/74 | HR 67 | Temp 98.4°F | Ht 70.0 in | Wt 221.0 lb

## 2022-10-11 DIAGNOSIS — E669 Obesity, unspecified: Secondary | ICD-10-CM | POA: Diagnosis not present

## 2022-10-11 DIAGNOSIS — Z6831 Body mass index (BMI) 31.0-31.9, adult: Secondary | ICD-10-CM | POA: Diagnosis not present

## 2022-10-11 DIAGNOSIS — R7303 Prediabetes: Secondary | ICD-10-CM

## 2022-10-11 NOTE — Progress Notes (Signed)
Office: [phone]  /  Fax: [phone]  WEIGHT SUMMARY AND BIOMETRICS  Weight Lost Since Last Visit: 2lb  Weight Gained Since Last Visit: 0lb   Vitals Temp: 98.4 F (36.9 C) BP: 118/74 Pulse Rate: 67 SpO2: 98 %   Anthropometric Measurements Height: 5\' 10"  (1.778 m) Weight: 221 lb (100.2 kg) BMI (Calculated): 31.71 Weight at Last Visit: 223lb Weight Lost Since Last Visit: 2lb Weight Gained Since Last Visit: 0lb Starting Weight: 236lb Total Weight Loss (lbs): 15 lb (6.804 kg)   Body Composition  Body Fat %: 24.1 % Fat Mass (lbs): 53.4 lbs Muscle Mass (lbs): 159.6 lbs Total Body Water (lbs): 107.8 lbs Visceral Fat Rating : 11   Other Clinical Data Fasting: No Labs: No Today's Visit #: 9 Starting Date: 04/25/22     HPI  Chief Complaint: OBESITY  Bradley James is here to discuss his progress with his obesity treatment plan. He is on the the Category 4 Plan and states he is following his eating plan approximately 25 % of the time. He states he is exercising 0 minutes 0 days per week.   Interval History:  Since last office visit he has lost 2 pounds.  He is struggling with portion sizes and eats when he's not hungry.     Pharmacotherapy for weight loss: He is currently taking Contrave 1 po BID but not on a regular basis for medical weight loss.  Started taking this week BID on a regular basis.  Denies side effects.    Previous pharmacotherapy for medical weight loss:  None  Bariatric surgery:  Patient has not had bariatric surgery    Prediabetes Last A1c was 5.7  Medication(s): has taken Metformin in the past and doesn't want to retake.  Polyphagia:Yes Lab Results  Component Value Date   HGBA1C 5.7 (H) 08/10/2022   HGBA1C 5.9 (H) 04/25/2022   Lab Results  Component Value Date   INSULIN 22.0 04/25/2022     PHYSICAL EXAM:  Blood pressure 118/74, pulse 67, temperature 98.4 F (36.9 C), height 5\' 10"  (1.778 m), weight 221 lb (100.2 kg), SpO2  98%. Body mass index is 31.71 kg/m.  General: He is overweight, cooperative, alert, well developed, and in no acute distress. PSYCH: Has normal mood, affect and thought process.   Extremities: No edema.  Neurologic: No gross sensory or motor deficits. No tremors or fasciculations noted.    DIAGNOSTIC DATA REVIEWED:  BMET    Component Value Date/Time   NA 141 08/10/2022 1449   K 4.3 08/10/2022 1449   CL 104 08/10/2022 1449   CO2 23 08/10/2022 1449   GLUCOSE 76 08/10/2022 1449   GLUCOSE 85 03/10/2022 0823   BUN 21 08/10/2022 1449   CREATININE 1.08 08/10/2022 1449   CREATININE 1.27 12/12/2019 0832   CALCIUM 9.4 08/10/2022 1449   GFRNONAA 71.66 02/17/2010 0902   GFRAA 93 12/24/2006 0927   Lab Results  Component Value Date   HGBA1C 5.7 (H) 08/10/2022   HGBA1C 5.9 (H) 04/25/2022   Lab Results  Component Value Date   INSULIN 22.0 04/25/2022   Lab Results  Component Value Date   TSH 4.96 03/10/2022   CBC    Component Value Date/Time   WBC 6.1 03/10/2022 0823   RBC 5.31 03/10/2022 0823   HGB 15.6 03/10/2022 0823   HCT 47.0 03/10/2022 0823   PLT 153.0 03/10/2022 0823   MCV 88.5 03/10/2022 0823   MCH 29.5 12/12/2019 0832   MCHC 33.2 03/10/2022 0823   RDW  14.8 03/10/2022 0823   Iron Studies    Component Value Date/Time   IRON 64 09/24/2015 1518   TIBC 282 09/24/2015 1518   IRONPCTSAT 23 09/24/2015 1518   Lipid Panel     Component Value Date/Time   CHOL 187 03/10/2022 0823   TRIG 107.0 03/10/2022 0823   HDL 49.70 03/10/2022 0823   CHOLHDL 4 03/10/2022 0823   VLDL 21.4 03/10/2022 0823   LDLCALC 116 (H) 03/10/2022 0823   LDLCALC 121 (H) 12/12/2019 0832   LDLDIRECT 141.6 12/24/2006 0927   Hepatic Function Panel     Component Value Date/Time   PROT 6.9 08/10/2022 1449   ALBUMIN 4.6 08/10/2022 1449   AST 30 08/10/2022 1449   ALT 29 08/10/2022 1449   ALKPHOS 57 08/10/2022 1449   BILITOT 0.5 08/10/2022 1449   BILIDIR 0.0 08/08/2012 1043      Component  Value Date/Time   TSH 4.96 03/10/2022 0823   Nutritional Lab Results  Component Value Date   VD25OH 39.3 08/10/2022   VD25OH 22.3 (L) 04/25/2022     ASSESSMENT AND PLAN  TREATMENT PLAN FOR OBESITY:  Recommended Dietary Goals  Bradley James is currently in the action stage of change. As such, his goal is to continue weight management plan. He has agreed to the Category 4 Plan.  Behavioral Intervention  We discussed the following Behavioral Modification Strategies today: increasing lean protein intake, decreasing simple carbohydrates , increasing vegetables, increasing lower glycemic fruits, increasing water intake, work on meal planning and preparation, reading food labels , keeping healthy foods at home, continue to practice mindfulness when eating, and planning for success.  Additional resources provided today: NA  Recommended Physical Activity Goals  Bradley James has been advised to work up to 150 minutes of moderate intensity aerobic activity a week and strengthening exercises 2-3 times per week for cardiovascular health, weight loss maintenance and preservation of muscle mass.   He has agreed to Think about ways to increase daily physical activity and overcoming barriers to exercise, Start strengthening exercises with a goal of 2-3 sessions a week , and Increase physical activity in their day and reduce sedentary time (increase NEAT).   Pharmacotherapy We discussed various medication options to help Energy with his weight loss efforts and we both agreed to continue Contrave and increase as tolerated.  Side effects discusssed.  ASSOCIATED CONDITIONS ADDRESSED TODAY  Action/Plan  Pre-diabetes Bradley James will continue to work on weight loss, exercise, and decreasing simple carbohydrates to help decrease the risk of diabetes.    Doesn't want to restart Metformin  Generalized obesity  BMI 31.0-31.9,adult      Return in about 4 weeks (around 11/08/2022).Marland Kitchen He was informed of the importance of  frequent follow up visits to maximize his success with intensive lifestyle modifications for his multiple health conditions.   ATTESTASTION STATEMENTS:  Reviewed by clinician on day of visit: allergies, medications, problem list, medical history, surgical history, family history, social history, and previous encounter notes.   Time spent on visit including pre-visit chart review and post-visit care and charting was 30 minutes.    Bradley Sato. Breanah Faddis FNP-C

## 2022-10-11 NOTE — Patient Instructions (Signed)

## 2022-11-20 ENCOUNTER — Ambulatory Visit: Payer: BC Managed Care – PPO | Admitting: Nurse Practitioner

## 2022-12-05 ENCOUNTER — Ambulatory Visit: Payer: BC Managed Care – PPO | Admitting: Nurse Practitioner

## 2022-12-18 ENCOUNTER — Ambulatory Visit (INDEPENDENT_AMBULATORY_CARE_PROVIDER_SITE_OTHER): Payer: BC Managed Care – PPO | Admitting: Nurse Practitioner

## 2022-12-18 ENCOUNTER — Encounter: Payer: Self-pay | Admitting: Nurse Practitioner

## 2022-12-18 VITALS — BP 137/81 | HR 68 | Temp 98.0°F | Ht 70.0 in | Wt 216.0 lb

## 2022-12-18 DIAGNOSIS — Z6830 Body mass index (BMI) 30.0-30.9, adult: Secondary | ICD-10-CM | POA: Diagnosis not present

## 2022-12-18 DIAGNOSIS — E559 Vitamin D deficiency, unspecified: Secondary | ICD-10-CM

## 2022-12-18 DIAGNOSIS — E669 Obesity, unspecified: Secondary | ICD-10-CM | POA: Diagnosis not present

## 2022-12-18 MED ORDER — CONTRAVE 8-90 MG PO TB12: ORAL_TABLET | ORAL | 0 refills | Status: DC

## 2022-12-18 MED ORDER — VITAMIN D (ERGOCALCIFEROL) 1.25 MG (50000 UNIT) PO CAPS
50000.0000 [IU] | ORAL_CAPSULE | ORAL | 0 refills | Status: DC
Start: 2022-12-18 — End: 2023-02-13

## 2022-12-18 NOTE — Progress Notes (Signed)
Office: [phone]  /  Fax: [phone]  WEIGHT SUMMARY AND BIOMETRICS  Weight Lost Since Last Visit: 5lb  Weight Gained Since Last Visit: 0lb   Vitals Temp: 98 F (36.7 C) BP: 137/81 Pulse Rate: 68 SpO2: 99 %   Anthropometric Measurements Height: 5\' 10"  (1.778 m) Weight: 216 lb (98 kg) BMI (Calculated): 30.99 Weight at Last Visit: 221lb Weight Lost Since Last Visit: 5lb Weight Gained Since Last Visit: 0lb Starting Weight: 236lb Total Weight Loss (lbs): 20 lb (9.072 kg)   Body Composition  Body Fat %: 22.1 % Fat Mass (lbs): 47.8 lbs Muscle Mass (lbs): 160.6 lbs Total Body Water (lbs): 104.8 lbs Visceral Fat Rating : 10   Other Clinical Data Fasting: No Labs: No Today's Visit #: 10 Starting Date: 04/25/22     HPI  Chief Complaint: OBESITY  Bradley James is here to discuss his progress with his obesity treatment plan. He is on the the Category 4 Plan and states he is following his eating plan approximately 50 % of the time. He states he is exercising 60 minutes 2-3 days per week.   Interval History:  Since last office visit he has lost 5 pounds.  He is not skipping meals and is eating a protein with each meal.  He is watching his portion sizes and is making healthier choices.  He is drinking water, G2 and juice.     Pharmacotherapy for weight loss: He is currently taking Contrave 1 po BID for medical weight loss-doesn't take on a regular basis due to side effects of constipation.  He will stop Contrave for a couple of days and once he has a BM he will restart taking it again.    Previous pharmacotherapy for medical weight loss:  None  Bariatric surgery:  Patient has not had bariatric surgery.   Vit D deficiency  He is taking Vit D 50,000 IU weekly.  Denies side effects.  Denies nausea, vomiting or muscle weakness.    Lab Results  Component Value Date   VD25OH 39.3 08/10/2022   VD25OH 22.3 (L) 04/25/2022      PHYSICAL EXAM:  Blood pressure 137/81,  pulse 68, temperature 98 F (36.7 C), height 5\' 10"  (1.778 m), weight 216 lb (98 kg), SpO2 99%. Body mass index is 30.99 kg/m.  General: He is overweight, cooperative, alert, well developed, and in no acute distress. PSYCH: Has normal mood, affect and thought process.   Extremities: No edema.  Neurologic: No gross sensory or motor deficits. No tremors or fasciculations noted.    DIAGNOSTIC DATA REVIEWED:  BMET    Component Value Date/Time   NA 141 08/10/2022 1449   K 4.3 08/10/2022 1449   CL 104 08/10/2022 1449   CO2 23 08/10/2022 1449   GLUCOSE 76 08/10/2022 1449   GLUCOSE 85 03/10/2022 0823   BUN 21 08/10/2022 1449   CREATININE 1.08 08/10/2022 1449   CREATININE 1.27 12/12/2019 0832   CALCIUM 9.4 08/10/2022 1449   GFRNONAA 71.66 02/17/2010 0902   GFRAA 93 12/24/2006 0927   Lab Results  Component Value Date   HGBA1C 5.7 (H) 08/10/2022   HGBA1C 5.9 (H) 04/25/2022   Lab Results  Component Value Date   INSULIN 22.0 04/25/2022   Lab Results  Component Value Date   TSH 4.96 03/10/2022   CBC    Component Value Date/Time   WBC 6.1 03/10/2022 0823   RBC 5.31 03/10/2022 0823   HGB 15.6 03/10/2022 0823   HCT 47.0 03/10/2022 0823  PLT 153.0 03/10/2022 0823   MCV 88.5 03/10/2022 0823   MCH 29.5 12/12/2019 0832   MCHC 33.2 03/10/2022 0823   RDW 14.8 03/10/2022 0823   Iron Studies    Component Value Date/Time   IRON 64 09/24/2015 1518   TIBC 282 09/24/2015 1518   IRONPCTSAT 23 09/24/2015 1518   Lipid Panel     Component Value Date/Time   CHOL 187 03/10/2022 0823   TRIG 107.0 03/10/2022 0823   HDL 49.70 03/10/2022 0823   CHOLHDL 4 03/10/2022 0823   VLDL 21.4 03/10/2022 0823   LDLCALC 116 (H) 03/10/2022 0823   LDLCALC 121 (H) 12/12/2019 0832   LDLDIRECT 141.6 12/24/2006 0927   Hepatic Function Panel     Component Value Date/Time   PROT 6.9 08/10/2022 1449   ALBUMIN 4.6 08/10/2022 1449   AST 30 08/10/2022 1449   ALT 29 08/10/2022 1449   ALKPHOS 57  08/10/2022 1449   BILITOT 0.5 08/10/2022 1449   BILIDIR 0.0 08/08/2012 1043      Component Value Date/Time   TSH 4.96 03/10/2022 0823   Nutritional Lab Results  Component Value Date   VD25OH 39.3 08/10/2022   VD25OH 22.3 (L) 04/25/2022     ASSESSMENT AND PLAN  TREATMENT PLAN FOR OBESITY:  Recommended Dietary Goals  Bradley James is currently in the action stage of change. As such, his goal is to continue weight management plan. He has agreed to the Category 4 Plan.  Behavioral Intervention  We discussed the following Behavioral Modification Strategies today: continue to work on maintaining a reduced calorie state, getting the recommended amount of protein, incorporating whole foods, making healthy choices, staying well hydrated and practicing mindfulness when eating..  Additional resources provided today: NA  Recommended Physical Activity Goals  Bradley James has been advised to work up to 150 minutes of moderate intensity aerobic activity a week and strengthening exercises 2-3 times per week for cardiovascular health, weight loss maintenance and preservation of muscle mass.   He has agreed to Continue current level of physical activity , Think about enjoyable ways to increase daily physical activity and overcoming barriers to exercise, and Increase physical activity in their day and reduce sedentary time (increase NEAT).   Pharmacotherapy We discussed various medication options to help Bradley James with his weight loss efforts and we both agreed to continue Contrave 1 po BID.  Side effects discussed.  ASSOCIATED CONDITIONS ADDRESSED TODAY  Action/Plan  Vitamin D deficiency -     Vitamin D (Ergocalciferol); Take 1 capsule (50,000 Units total) by mouth every 7 (seven) days.  Dispense: 5 capsule; Refill: 0  Generalized obesity -     refill Contrave; Start 1 tablet every morning for 7 days, then 1 tablet twice daily for 7 days, then 2 tablets every morning and one every evening for 7 days and  then 2 tablets po BID  Dispense: 120 tablet; Refill: 0. Can increase if needed.  Will monitor side effects.  If worsens or persist to let me know.    BMI 30.0-30.9,adult      Will check labs at next visit.    Return in about 2 months (around 02/17/2023).Marland Kitchen He was informed of the importance of frequent follow up visits to maximize his success with intensive lifestyle modifications for his multiple health conditions.   ATTESTASTION STATEMENTS:  Reviewed by clinician on day of visit: allergies, medications, problem list, medical history, surgical history, family history, social history, and previous encounter notes.     Theodis Sato. Crystie Yanko FNP-C

## 2023-02-13 ENCOUNTER — Ambulatory Visit (INDEPENDENT_AMBULATORY_CARE_PROVIDER_SITE_OTHER): Payer: BC Managed Care – PPO | Admitting: Nurse Practitioner

## 2023-02-13 ENCOUNTER — Encounter: Payer: Self-pay | Admitting: Nurse Practitioner

## 2023-02-13 VITALS — BP 119/74 | HR 63 | Temp 98.1°F | Ht 70.0 in | Wt 219.0 lb

## 2023-02-13 DIAGNOSIS — Z6831 Body mass index (BMI) 31.0-31.9, adult: Secondary | ICD-10-CM

## 2023-02-13 DIAGNOSIS — E559 Vitamin D deficiency, unspecified: Secondary | ICD-10-CM | POA: Diagnosis not present

## 2023-02-13 DIAGNOSIS — E669 Obesity, unspecified: Secondary | ICD-10-CM

## 2023-02-13 DIAGNOSIS — R632 Polyphagia: Secondary | ICD-10-CM | POA: Diagnosis not present

## 2023-02-13 MED ORDER — QSYMIA 7.5-46 MG PO CP24
ORAL_CAPSULE | ORAL | 0 refills | Status: DC
Start: 2023-02-13 — End: 2023-03-27

## 2023-02-13 MED ORDER — QSYMIA 3.75-23 MG PO CP24
ORAL_CAPSULE | ORAL | 0 refills | Status: DC
Start: 2023-02-13 — End: 2023-03-27

## 2023-02-13 MED ORDER — VITAMIN D (ERGOCALCIFEROL) 1.25 MG (50000 UNIT) PO CAPS: 50000.0000 [IU] | ORAL_CAPSULE | ORAL | 0 refills | Status: DC

## 2023-02-13 NOTE — Patient Instructions (Signed)
What is Qsymia and how does it work?  Qsymia is a prescription only medicine to help with your weight loss. It is a combination of two medicines that are low dose, long-acting: Phentermine & Topiramate. Qsymia contains low dose Phentermine which is a stimulant medicine that could affect your heart rate and blood pressure Qsymia is designed to help you feel satisfied faster that will help you to decrease portion size. Also, it helps curb late night snacking habits. Some food you usually enjoy may start to taste differently which will help you make healthier food choices.  This medicine will be most effective when combined with a reduced calorie diet and physical activity.  How should I take Qsymia? Take daily in the morning with breakfast. Swallow the extended-release capsule whole. Do not crush, break, or chew it.  If you miss a dose, take it as soon as possible. If it is after 12pm, skip the missed dose and go back to your regular dosing schedule. Do not take extra medicine to make up for the missed dose. You have received two separate prescriptions today. You will initially take a lower dose of 3.75mg/23mg for 14 days then increase to a higher dose of 7.5mg/46mg for maintenance for 30 days. There are 4 total dosing options. Your provider will discuss any need to go up to a higher dose during your office visits.  If you are taking Levothyroxine, take the Levothyroxine 1 hours before breakfast and the take the Qsymia 1 hour after breakfast. Do not stop taking Qsymia without talking to your provider. Stopping Qsymia suddenly can cause serious side effects, such as seizures and headaches.   What should I avoid while taking Qsymia? Limit caffeine to 1 small cup daily. Examples are soda, coffee, tea, herbal tea, energy drinks, and chocolate Avoid decongestant medicines like Sudafed, Mucinex-D, and Zyrtec-D. Qsymia may cause you to feel dizzy, drowsy, or confused, or to have trouble thinking  or speaking. Do not drive or do anything else that could be dangerous until you know how this medicine affects you.  Women who can become pregnant: Use effective birth control (contraception) consistently while taking Qsymia. If you miss a menstrual period, STOP Qsymia and call our office immediately. Pregnancy tests will be performed at your appointment if indicated.  What side effects may I notice when taking Qsymia? Side effects that usually do not require medical attention (report to our office if they continue or are bothersome): Dry mouth (drink at least 64 oz of fluid daily) Constipation (you may take over the counter laxative if needed) Metallic taste in your mouth when drinking carbonated beverages Numbness or tingling in the hands, arms, feet or face that lasts more than a week Headache Sudden changes in vision Mental fuzziness (problems with concentration, attention, memory or speech) Trouble sleeping (insomnia) Side effects that you should report to our office as soon as possible: Increases in heart rate and/or palpitations (feeling like your heart is racing or pounding in your chest that lasts several minutes) Chest pain Increased blood pressure Dizziness or feeling faint Shortness of breath Irritability Feeling anxious, agitated, restless, or nervous Depression or severe changes in mood Problems urinating Unusual swelling of the legs Vomiting   Other important information You will be asked to sign an informed consent prior to starting Qysmia Qsymia is a federally controlled substance. Keep Qsymia in a safe place to prevent misuse and abuse. Selling or giving away Qsymia may harm others, and is against the law.  Your prescription   will be sent to the pharmacy during your visit.  Refills will require an office visit. Your insurance may not cover the cost of this medicine. Our office will complete a pre-authorization if required by your insurance.  

## 2023-02-13 NOTE — Progress Notes (Signed)
Office: [phone]  /  Fax: [phone]  WEIGHT SUMMARY AND BIOMETRICS  Weight Lost Since Last Visit: 0lb  Weight Gained Since Last Visit: 3lb   Vitals Temp: 98.1 F (36.7 C) BP: 119/74 Pulse Rate: 63 SpO2: 96 %   Anthropometric Measurements Height: 5\' 10"  (1.778 m) Weight: 219 lb (99.3 kg) BMI (Calculated): 31.42 Weight at Last Visit: 216lb Weight Lost Since Last Visit: 0lb Weight Gained Since Last Visit: 3lb Starting Weight: 236lb Total Weight Loss (lbs): 17 lb (7.711 kg)   Body Composition  Body Fat %: 23.6 % Fat Mass (lbs): 51.8 lbs Muscle Mass (lbs): 159.8 lbs Total Body Water (lbs): 107.6 lbs Visceral Fat Rating : 11   Other Clinical Data Fasting: No Labs: No Today's Visit #: 11 Starting Date: 04/25/22     HPI  Chief Complaint: OBESITY  Bradley James is here to discuss his progress with his obesity treatment plan. He is on the the Category 4 Plan and states he is following his eating plan approximately 50 % of the time. He states he is exercising 30 minutes 1 days per week.   Interval History:  Since last office visit he has gained 3 pounds.  He is currently working night shift for 6 weeks.  He rotates night shift and day shift every 6 weeks. He hasn't been taking his Contrave on a regular basis since he has been back on night shift. Notes he gets off track when he's working night shift. When he doesn't take the Contrave he struggles with polyphagia and cravings. He is drinking water, tea, lemonade and Coke.   When he works day shift-7a-7p he takes Contrave 2 am and 1 po When he works night shift-7p-7a he takes Contrave 1 am  Pharmacotherapy for weight loss: He is currently taking Contrave 1 daily but not on a regular basis due to working night shift.  He reports side effects of insomnia but only when he works night shift.  He has been continuing to take it at breakfast no matter what shift he is working.    Previous pharmacotherapy for medical weight  loss:  None   Bariatric surgery:  Patient has not had bariatric surgery.   Vit D deficiency  He is taking Vit D 50,000 IU weekly.  Denies side effects.  Denies nausea, vomiting or muscle weakness.    Lab Results  Component Value Date   VD25OH 39.3 08/10/2022   VD25OH 22.3 (L) 04/25/2022      PHYSICAL EXAM:  Blood pressure 119/74, pulse 63, temperature 98.1 F (36.7 C), height 5\' 10"  (1.778 m), weight 219 lb (99.3 kg), SpO2 96%. Body mass index is 31.42 kg/m.  General: He is overweight, cooperative, alert, well developed, and in no acute distress. PSYCH: Has normal mood, affect and thought process.   Extremities: No edema.  Neurologic: No gross sensory or motor deficits. No tremors or fasciculations noted.    DIAGNOSTIC DATA REVIEWED:  BMET    Component Value Date/Time   NA 141 08/10/2022 1449   K 4.3 08/10/2022 1449   CL 104 08/10/2022 1449   CO2 23 08/10/2022 1449   GLUCOSE 76 08/10/2022 1449   GLUCOSE 85 03/10/2022 0823   BUN 21 08/10/2022 1449   CREATININE 1.08 08/10/2022 1449   CREATININE 1.27 12/12/2019 0832   CALCIUM 9.4 08/10/2022 1449   GFRNONAA 71.66 02/17/2010 0902   GFRAA 93 12/24/2006 0927   Lab Results  Component Value Date   HGBA1C 5.7 (H) 08/10/2022   HGBA1C 5.9 (  H) 04/25/2022   Lab Results  Component Value Date   INSULIN 22.0 04/25/2022   Lab Results  Component Value Date   TSH 4.96 03/10/2022   CBC    Component Value Date/Time   WBC 6.1 03/10/2022 0823   RBC 5.31 03/10/2022 0823   HGB 15.6 03/10/2022 0823   HCT 47.0 03/10/2022 0823   PLT 153.0 03/10/2022 0823   MCV 88.5 03/10/2022 0823   MCH 29.5 12/12/2019 0832   MCHC 33.2 03/10/2022 0823   RDW 14.8 03/10/2022 0823   Iron Studies    Component Value Date/Time   IRON 64 09/24/2015 1518   TIBC 282 09/24/2015 1518   IRONPCTSAT 23 09/24/2015 1518   Lipid Panel     Component Value Date/Time   CHOL 187 03/10/2022 0823   TRIG 107.0 03/10/2022 0823   HDL 49.70 03/10/2022 0823    CHOLHDL 4 03/10/2022 0823   VLDL 21.4 03/10/2022 0823   LDLCALC 116 (H) 03/10/2022 0823   LDLCALC 121 (H) 12/12/2019 0832   LDLDIRECT 141.6 12/24/2006 0927   Hepatic Function Panel     Component Value Date/Time   PROT 6.9 08/10/2022 1449   ALBUMIN 4.6 08/10/2022 1449   AST 30 08/10/2022 1449   ALT 29 08/10/2022 1449   ALKPHOS 57 08/10/2022 1449   BILITOT 0.5 08/10/2022 1449   BILIDIR 0.0 08/08/2012 1043      Component Value Date/Time   TSH 4.96 03/10/2022 0823   Nutritional Lab Results  Component Value Date   VD25OH 39.3 08/10/2022   VD25OH 22.3 (L) 04/25/2022     ASSESSMENT AND PLAN  TREATMENT PLAN FOR OBESITY:  Recommended Dietary Goals  Bradley James is currently in the action stage of change. As such, his goal is to continue weight management plan. He has agreed to the Category 4 Plan.  Behavioral Intervention  We discussed the following Behavioral Modification Strategies today: increasing lean protein intake to established goals, increasing water intake , celebration eating strategies, and continue to work on maintaining a reduced calorie state, getting the recommended amount of protein, incorporating whole foods, making healthy choices, staying well hydrated and practicing mindfulness when eating..  Additional resources provided today: NA  Recommended Physical Activity Goals  Bradley James has been advised to work up to 150 minutes of moderate intensity aerobic activity a week and strengthening exercises 2-3 times per week for cardiovascular health, weight loss maintenance and preservation of muscle mass.   He has agreed to Think about enjoyable ways to increase daily physical activity and overcoming barriers to exercise, Increase physical activity in their day and reduce sedentary time (increase NEAT)., Increase the intensity, frequency or duration of strengthening exercises , and Increase the intensity, frequency or duration of aerobic exercises     Pharmacotherapy We  discussed various medication options to help Bradley James with his weight loss efforts and we both agreed to stop Contrave and start Qsymia.  Side effects discussed.    ASSOCIATED CONDITIONS ADDRESSED TODAY  Action/Plan  Vitamin D deficiency -     Vitamin D (Ergocalciferol); Take 1 capsule (50,000 Units total) by mouth every 7 (seven) days.  Dispense: 5 capsule; Refill: 0  Polyphagia -     Qsymia; Take one po daily for the first 2 weeks.  Dispense: 14 capsule; Refill: 0 -     Qsymia; After taking dose 1 for the first 2 weeks, then start taking Qsymia 7.5-46mg  po daily  Dispense: 30 capsule; Refill: 0  Generalized obesity -     Qsymia;  Take one po daily for the first 2 weeks.  Dispense: 14 capsule; Refill: 0 -     Qsymia; After taking dose 1 for the first 2 weeks, then start taking Qsymia 7.5-46mg  po daily  Dispense: 30 capsule; Refill: 0  BMI 31.0-31.9,adult       He has a CPE and labs in Feb.   Return in about 6 weeks (around 03/27/2023).Marland Kitchen He was informed of the importance of frequent follow up visits to maximize his success with intensive lifestyle modifications for his multiple health conditions.   ATTESTASTION STATEMENTS:  Reviewed by clinician on day of visit: allergies, medications, problem list, medical history, surgical history, family history, social history, and previous encounter notes.     Theodis Sato. Hedaya Latendresse FNP-C

## 2023-03-27 ENCOUNTER — Encounter: Payer: Self-pay | Admitting: Nurse Practitioner

## 2023-03-27 ENCOUNTER — Ambulatory Visit (INDEPENDENT_AMBULATORY_CARE_PROVIDER_SITE_OTHER): Payer: BC Managed Care – PPO | Admitting: Nurse Practitioner

## 2023-03-27 VITALS — BP 123/78 | HR 66 | Temp 98.2°F | Ht 70.0 in | Wt 225.0 lb

## 2023-03-27 DIAGNOSIS — Z6832 Body mass index (BMI) 32.0-32.9, adult: Secondary | ICD-10-CM | POA: Diagnosis not present

## 2023-03-27 DIAGNOSIS — R632 Polyphagia: Secondary | ICD-10-CM

## 2023-03-27 DIAGNOSIS — E669 Obesity, unspecified: Secondary | ICD-10-CM

## 2023-03-27 DIAGNOSIS — E559 Vitamin D deficiency, unspecified: Secondary | ICD-10-CM

## 2023-03-27 MED ORDER — QSYMIA 7.5-46 MG PO CP24
ORAL_CAPSULE | ORAL | 0 refills | Status: DC
Start: 2023-03-27 — End: 2023-05-03

## 2023-03-27 MED ORDER — QSYMIA 3.75-23 MG PO CP24: ORAL_CAPSULE | ORAL | 0 refills | Status: DC

## 2023-03-27 NOTE — Progress Notes (Signed)
Office: [phone]  /  Fax: [phone]  WEIGHT SUMMARY AND BIOMETRICS  Weight Lost Since Last Visit: 0lb  Weight Gained Since Last Visit: 6lb   Vitals Temp: 98.2 F (36.8 C) BP: 123/78 Pulse Rate: 66 SpO2: 98 %   Anthropometric Measurements Height: 5\' 10"  (1.778 m) Weight: 225 lb (102.1 kg) BMI (Calculated): 32.28 Weight at Last Visit: 219lb Weight Lost Since Last Visit: 0lb Weight Gained Since Last Visit: 6lb Starting Weight: 236lb Total Weight Loss (lbs): 11 lb (4.99 kg)   Body Composition  Body Fat %: 24.2 % Fat Mass (lbs): 54.4 lbs Muscle Mass (lbs): 162.4 lbs Total Body Water (lbs): 108.6 lbs Visceral Fat Rating : 11   Other Clinical Data Fasting: No Labs: No Today's Visit #: 12 Starting Date: 04/25/22     HPI  Chief Complaint: OBESITY  Bradley James is here to discuss his progress with his obesity treatment plan. He is on the the Category 4 Plan and states he is following his eating plan approximately 0 % of the time. He states he is exercising 0 minutes 0 days per week.   Interval History:  Since last office visit he has gained 6 pounds. He celebrated Christmas and got off track.  Has been eating out more. He stopped taking Contrave 2 weeks ago.  He is struggling with polyphagia and cravings since stopping Contrave.  He never started Qsymia.  His mother cooks for him and he eats at her house most evenings. His mother prepares his lunch.  He eats breakfast at home.  He is drinking water, coke zero and sometimes juice. He plans to start Karate next week.     Pharmacotherapy for weight loss: He is not currently taking any medications for medical weight loss.    Previous pharmacotherapy for medical weight loss:  Contrave-took last 2 weeks ago.     Bariatric surgery:  Patient has not had bariatric surgery.   Vit D deficiency  He is taking Vit D 50,000 IU weekly.  Denies side effects.  Denies nausea, vomiting or muscle weakness.    Lab Results   Component Value Date   VD25OH 39.3 08/10/2022   VD25OH 22.3 (L) 04/25/2022       PHYSICAL EXAM:  Blood pressure 123/78, pulse 66, temperature 98.2 F (36.8 C), height 5\' 10"  (1.778 m), weight 225 lb (102.1 kg), SpO2 98%. Body mass index is 32.28 kg/m.  General: He is overweight, cooperative, alert, well developed, and in no acute distress. PSYCH: Has normal mood, affect and thought process.   Extremities: No edema.  Neurologic: No gross sensory or motor deficits. No tremors or fasciculations noted.    DIAGNOSTIC DATA REVIEWED:  BMET    Component Value Date/Time   NA 141 08/10/2022 1449   K 4.3 08/10/2022 1449   CL 104 08/10/2022 1449   CO2 23 08/10/2022 1449   GLUCOSE 76 08/10/2022 1449   GLUCOSE 85 03/10/2022 0823   BUN 21 08/10/2022 1449   CREATININE 1.08 08/10/2022 1449   CREATININE 1.27 12/12/2019 0832   CALCIUM 9.4 08/10/2022 1449   GFRNONAA 71.66 02/17/2010 0902   GFRAA 93 12/24/2006 0927   Lab Results  Component Value Date   HGBA1C 5.7 (H) 08/10/2022   HGBA1C 5.9 (H) 04/25/2022   Lab Results  Component Value Date   INSULIN 22.0 04/25/2022   Lab Results  Component Value Date   TSH 4.96 03/10/2022   CBC    Component Value Date/Time   WBC 6.1 03/10/2022 0823  RBC 5.31 03/10/2022 0823   HGB 15.6 03/10/2022 0823   HCT 47.0 03/10/2022 0823   PLT 153.0 03/10/2022 0823   MCV 88.5 03/10/2022 0823   MCH 29.5 12/12/2019 0832   MCHC 33.2 03/10/2022 0823   RDW 14.8 03/10/2022 0823   Iron Studies    Component Value Date/Time   IRON 64 09/24/2015 1518   TIBC 282 09/24/2015 1518   IRONPCTSAT 23 09/24/2015 1518   Lipid Panel     Component Value Date/Time   CHOL 187 03/10/2022 0823   TRIG 107.0 03/10/2022 0823   HDL 49.70 03/10/2022 0823   CHOLHDL 4 03/10/2022 0823   VLDL 21.4 03/10/2022 0823   LDLCALC 116 (H) 03/10/2022 0823   LDLCALC 121 (H) 12/12/2019 0832   LDLDIRECT 141.6 12/24/2006 0927   Hepatic Function Panel     Component Value  Date/Time   PROT 6.9 08/10/2022 1449   ALBUMIN 4.6 08/10/2022 1449   AST 30 08/10/2022 1449   ALT 29 08/10/2022 1449   ALKPHOS 57 08/10/2022 1449   BILITOT 0.5 08/10/2022 1449   BILIDIR 0.0 08/08/2012 1043      Component Value Date/Time   TSH 4.96 03/10/2022 0823   Nutritional Lab Results  Component Value Date   VD25OH 39.3 08/10/2022   VD25OH 22.3 (L) 04/25/2022     ASSESSMENT AND PLAN  TREATMENT PLAN FOR OBESITY:  Recommended Dietary Goals  Reyan is currently in the action stage of change. As such, his goal is to continue weight management plan. He has agreed to the Category 4 Plan.  Behavioral Intervention  We discussed the following Behavioral Modification Strategies today: increasing lean protein intake to established goals, decreasing simple carbohydrates , increasing water intake , work on meal planning and preparation, reading food labels , keeping healthy foods at home, planning for success, better snacking choices, and continue to work on maintaining a reduced calorie state, getting the recommended amount of protein, incorporating whole foods, making healthy choices, staying well hydrated and practicing mindfulness when eating..  Additional resources provided today: NA  Recommended Physical Activity Goals  Berl has been advised to work up to 150 minutes of moderate intensity aerobic activity a week and strengthening exercises 2-3 times per week for cardiovascular health, weight loss maintenance and preservation of muscle mass.   He has agreed to Think about enjoyable ways to increase daily physical activity and overcoming barriers to exercise, Increase physical activity in their day and reduce sedentary time (increase NEAT)., and Work on scheduling and tracking physical activity.    Pharmacotherapy We discussed various medication options to help [place] with his weight loss efforts and we both agreed to start Qsymia dose 1 for 2 weeks then increase to dose 2 once  daily.  Side effects discussed..  ASSOCIATED CONDITIONS ADDRESSED TODAY  Action/Plan  Polyphagia -     Qsymia; Take one po daily for the first 2 weeks.  Dispense: 14 capsule; Refill: 0 -     Qsymia; After taking dose 1 for the first 2 weeks, then start taking Qsymia 7.5-46mg  po daily  Dispense: 30 capsule; Refill: 0  Vitamin D deficiency Continue vitamin D as directed.  Generalized obesity -     Qsymia; Take one po daily for the first 2 weeks.  Dispense: 14 capsule; Refill: 0 -     Qsymia; After taking dose 1 for the first 2 weeks, then start taking Qsymia 7.5-46mg  po daily  Dispense: 30 capsule; Refill: 0  BMI 32.0-32.9,adult  He has an appt with his PCP next week for CPE and labs.     Return in about 4 weeks (around 04/24/2023).Marland Kitchen He was informed of the importance of frequent follow up visits to maximize his success with intensive lifestyle modifications for his multiple health conditions.   ATTESTASTION STATEMENTS:  Reviewed by clinician on day of visit: allergies, medications, problem list, medical history, surgical history, family history, social history, and previous encounter notes.     Theodis Sato. Nyeshia Mysliwiec FNP-C

## 2023-03-27 NOTE — Patient Instructions (Signed)
What is Qsymia and how does it work?  Qsymia is a prescription only medicine to help with your weight loss. It is a combination of two medicines that are low dose, long-acting: Phentermine & Topiramate. Qsymia contains low dose Phentermine which is a stimulant medicine that could affect your heart rate and blood pressure Qsymia is designed to help you feel satisfied faster that will help you to decrease portion size. Also, it helps curb late night snacking habits. Some food you usually enjoy may start to taste differently which will help you make healthier food choices.  This medicine will be most effective when combined with a reduced calorie diet and physical activity.  How should I take Qsymia? Take daily in the morning with breakfast. Swallow the extended-release capsule whole. Do not crush, break, or chew it.  If you miss a dose, take it as soon as possible. If it is after 12pm, skip the missed dose and go back to your regular dosing schedule. Do not take extra medicine to make up for the missed dose. You have received two separate prescriptions today. You will initially take a lower dose of 3.75mg /23mg  for 14 days then increase to a higher dose of 7.5mg /46mg  for maintenance for 30 days. There are 4 total dosing options. Your provider will discuss any need to go up to a higher dose during your office visits.  If you are taking Levothyroxine, take the Levothyroxine 1 hours before breakfast and the take the Qsymia 1 hour after breakfast. Do not stop taking Qsymia without talking to your provider. Stopping Qsymia suddenly can cause serious side effects, such as seizures and headaches.   What should I avoid while taking Qsymia? Limit caffeine to 1 small cup daily. Examples are soda, coffee, tea, herbal tea, energy drinks, and chocolate Avoid decongestant medicines like Sudafed, Mucinex-D, and Zyrtec-D. Qsymia may cause you to feel dizzy, drowsy, or confused, or to have trouble thinking  or speaking. Do not drive or do anything else that could be dangerous until you know how this medicine affects you.  Women who can become pregnant: Use effective birth control (contraception) consistently while taking Qsymia. If you miss a menstrual period, STOP Qsymia and call our office immediately. Pregnancy tests will be performed at your appointment if indicated.  What side effects may I notice when taking Qsymia? Side effects that usually do not require medical attention (report to our office if they continue or are bothersome): Dry mouth (drink at least 64 oz of fluid daily) Constipation (you may take over the counter laxative if needed) Metallic taste in your mouth when drinking carbonated beverages Numbness or tingling in the hands, arms, feet or face that lasts more than a week Headache Sudden changes in vision Mental fuzziness (problems with concentration, attention, memory or speech) Trouble sleeping (insomnia) Side effects that you should report to our office as soon as possible: Increases in heart rate and/or palpitations (feeling like your heart is racing or pounding in your chest that lasts several minutes) Chest pain Increased blood pressure Dizziness or feeling faint Shortness of breath Irritability Feeling anxious, agitated, restless, or nervous Depression or severe changes in mood Problems urinating Unusual swelling of the legs Vomiting   Other important information You will be asked to sign an informed consent prior to starting Qysmia Qsymia is a federally controlled substance. Keep Qsymia in a safe place to prevent misuse and abuse. Selling or giving away Qsymia may harm others, and is against the law.  Your prescription  will be sent to the pharmacy during your visit.  Refills will require an office visit. Your insurance may not cover the cost of this medicine. Our office will complete a pre-authorization if required by your insurance.

## 2023-03-30 ENCOUNTER — Telehealth: Payer: Self-pay

## 2023-03-30 DIAGNOSIS — E559 Vitamin D deficiency, unspecified: Secondary | ICD-10-CM

## 2023-03-30 DIAGNOSIS — R739 Hyperglycemia, unspecified: Secondary | ICD-10-CM

## 2023-03-30 DIAGNOSIS — Z0000 Encounter for general adult medical examination without abnormal findings: Secondary | ICD-10-CM

## 2023-03-30 NOTE — Telephone Encounter (Signed)
 Please advise

## 2023-03-30 NOTE — Telephone Encounter (Signed)
Tried calling Pt to schedule lab appt to be done several days before cpx appt in March. No answer, unable to leave message.

## 2023-03-30 NOTE — Telephone Encounter (Signed)
BMP FLP CBC A1c TSH vitamin D

## 2023-03-30 NOTE — Telephone Encounter (Signed)
Copied from CRM [id number]. Topic: Clinical - Request for Lab/Test Order >> Mar 30, 2023  9:55 AM Isabell A wrote: Reason for CRM: Patient is requesting lab orders so he can complete his lab prior to his physical because there are no early morning physical appointments available.

## 2023-04-03 ENCOUNTER — Encounter: Payer: BC Managed Care – PPO | Admitting: Internal Medicine

## 2023-05-03 ENCOUNTER — Ambulatory Visit: Payer: BC Managed Care – PPO | Admitting: Nurse Practitioner

## 2023-05-03 ENCOUNTER — Encounter: Payer: Self-pay | Admitting: Nurse Practitioner

## 2023-05-03 VITALS — BP 115/72 | HR 76 | Temp 98.1°F | Ht 70.0 in | Wt 222.0 lb

## 2023-05-03 DIAGNOSIS — R632 Polyphagia: Secondary | ICD-10-CM | POA: Diagnosis not present

## 2023-05-03 DIAGNOSIS — E669 Obesity, unspecified: Secondary | ICD-10-CM | POA: Diagnosis not present

## 2023-05-03 DIAGNOSIS — Z6831 Body mass index (BMI) 31.0-31.9, adult: Secondary | ICD-10-CM | POA: Diagnosis not present

## 2023-05-03 DIAGNOSIS — E559 Vitamin D deficiency, unspecified: Secondary | ICD-10-CM | POA: Diagnosis not present

## 2023-05-03 MED ORDER — QSYMIA 7.5-46 MG PO CP24
ORAL_CAPSULE | ORAL | 0 refills | Status: DC
Start: 2023-05-03 — End: 2023-07-17

## 2023-05-03 MED ORDER — VITAMIN D (ERGOCALCIFEROL) 1.25 MG (50000 UNIT) PO CAPS: 50000.0000 [IU] | ORAL_CAPSULE | ORAL | 0 refills | Status: DC

## 2023-05-03 NOTE — Progress Notes (Signed)
 Office: [phone]  /  Fax: [phone]  WEIGHT SUMMARY AND BIOMETRICS  Weight Lost Since Last Visit: 3lb  Weight Gained Since Last Visit: 0lb   Vitals Temp: 98.1 F (36.7 C) BP: 115/72 Pulse Rate: 76 SpO2: 97 %   Anthropometric Measurements Height: 5\' 10"  (1.778 m) Weight: 222 lb (100.7 kg) BMI (Calculated): 31.85 Weight at Last Visit: 225lb Weight Lost Since Last Visit: 3lb Weight Gained Since Last Visit: 0lb Starting Weight: 236lb Total Weight Loss (lbs): 14 lb (6.35 kg)   Body Composition  Body Fat %: 24.5 % Fat Mass (lbs): 54.6 lbs Muscle Mass (lbs): 159.8 lbs Total Body Water (lbs): 107 lbs Visceral Fat Rating : 12   Other Clinical Data Fasting: No Labs: No Today's Visit #: 13 Starting Date: 04/25/22     HPI  Chief Complaint: OBESITY  Bradley James is here to discuss his progress with his obesity treatment plan. He is on the the Category 4 Plan and states he is following his eating plan approximately 75 % of the time. He states he is exercising 0 minutes 0 days per week.   Interval History:  Since last office visit he has lost 3 pounds.  He is not skipping meals.  He is eating a protein with each meal.  He is drinking water, G2 and diet Coke. He plans to join the [place].  Pharmacotherapy for weight loss: He is currently taking Qsymia dose 2 for medical weight loss. Denies side effects.  Denies chest pain, SHOB or palpitations.  Has helped with polyphagia and cravings.     Previous pharmacotherapy for medical weight loss:  Contrave    Bariatric surgery:  Patient has not had bariatric surgery.   Vit D deficiency  He is taking Vit D 50,000 IU weekly.  Denies side effects.  Denies nausea, vomiting or muscle weakness.    Lab Results  Component Value Date   VD25OH 39.3 08/10/2022   VD25OH 22.3 (L) 04/25/2022     PHYSICAL EXAM:  Blood pressure 115/72, pulse 76, temperature 98.1 F (36.7 C), height 5\' 10"  (1.778 m), weight 222 lb (100.7 kg), SpO2  97%. Body mass index is 31.85 kg/m.  General: He is overweight, cooperative, alert, well developed, and in no acute distress. PSYCH: Has normal mood, affect and thought process.   Extremities: No edema.  Neurologic: No gross sensory or motor deficits. No tremors or fasciculations noted.    DIAGNOSTIC DATA REVIEWED:  BMET    Component Value Date/Time   NA 141 08/10/2022 1449   K 4.3 08/10/2022 1449   CL 104 08/10/2022 1449   CO2 23 08/10/2022 1449   GLUCOSE 76 08/10/2022 1449   GLUCOSE 85 03/10/2022 0823   BUN 21 08/10/2022 1449   CREATININE 1.08 08/10/2022 1449   CREATININE 1.27 12/12/2019 0832   CALCIUM 9.4 08/10/2022 1449   GFRNONAA 71.66 02/17/2010 0902   GFRAA 93 12/24/2006 0927   Lab Results  Component Value Date   HGBA1C 5.7 (H) 08/10/2022   HGBA1C 5.9 (H) 04/25/2022   Lab Results  Component Value Date   INSULIN 22.0 04/25/2022   Lab Results  Component Value Date   TSH 4.96 03/10/2022   CBC    Component Value Date/Time   WBC 6.1 03/10/2022 0823   RBC 5.31 03/10/2022 0823   HGB 15.6 03/10/2022 0823   HCT 47.0 03/10/2022 0823   PLT 153.0 03/10/2022 0823   MCV 88.5 03/10/2022 0823   MCH 29.5 12/12/2019 0832   MCHC 33.2 03/10/2022  0981   RDW 14.8 03/10/2022 0823   Iron Studies    Component Value Date/Time   IRON 64 09/24/2015 1518   TIBC 282 09/24/2015 1518   IRONPCTSAT 23 09/24/2015 1518   Lipid Panel     Component Value Date/Time   CHOL 187 03/10/2022 0823   TRIG 107.0 03/10/2022 0823   HDL 49.70 03/10/2022 0823   CHOLHDL 4 03/10/2022 0823   VLDL 21.4 03/10/2022 0823   LDLCALC 116 (H) 03/10/2022 0823   LDLCALC 121 (H) 12/12/2019 0832   LDLDIRECT 141.6 12/24/2006 0927   Hepatic Function Panel     Component Value Date/Time   PROT 6.9 08/10/2022 1449   ALBUMIN 4.6 08/10/2022 1449   AST 30 08/10/2022 1449   ALT 29 08/10/2022 1449   ALKPHOS 57 08/10/2022 1449   BILITOT 0.5 08/10/2022 1449   BILIDIR 0.0 08/08/2012 1043      Component  Value Date/Time   TSH 4.96 03/10/2022 0823   Nutritional Lab Results  Component Value Date   VD25OH 39.3 08/10/2022   VD25OH 22.3 (L) 04/25/2022     ASSESSMENT AND PLAN  TREATMENT PLAN FOR OBESITY:  Recommended Dietary Goals  Bradley James is currently in the action stage of change. As such, his goal is to continue weight management plan. He has agreed to the Category 4 Plan.  Behavioral Intervention  We discussed the following Behavioral Modification Strategies today: increasing lean protein intake to established goals, decreasing simple carbohydrates , increasing vegetables, increasing fiber rich foods, increasing water intake , work on meal planning and preparation, reading food labels , keeping healthy foods at home, continue to work on implementation of reduced calorie nutritional plan, continue to practice mindfulness when eating, planning for success, better snacking choices, and continue to work on maintaining a reduced calorie state, getting the recommended amount of protein, incorporating whole foods, making healthy choices, staying well hydrated and practicing mindfulness when eating..  Additional resources provided today: NA  Recommended Physical Activity Goals  Bradley James has been advised to work up to 150 minutes of moderate intensity aerobic activity a week and strengthening exercises 2-3 times per week for cardiovascular health, weight loss maintenance and preservation of muscle mass.   He has agreed to Think about enjoyable ways to increase daily physical activity and overcoming barriers to exercise, Increase physical activity in their day and reduce sedentary time (increase NEAT)., and Work on scheduling and tracking physical activity.    Pharmacotherapy We discussed various medication options to help Bradley James with his weight loss efforts and we both agreed to continue Qsymia dose 2.  Side effects discussed.  ASSOCIATED CONDITIONS ADDRESSED TODAY  Action/Plan  Polyphagia -      Qsymia; Take one po daily  Dispense: 30 capsule; Refill: 0  Vitamin D deficiency -     Vitamin D (Ergocalciferol); Take 1 capsule (50,000 Units total) by mouth every 7 (seven) days.  Dispense: 5 capsule; Refill: 0  Generalized obesity -     Qsymia; Take one po daily  Dispense: 30 capsule; Refill: 0      Has appt with PCP on 05/21/23 for follow up and labs.    Return in about 6 weeks (around 06/14/2023).Marland Kitchen He was informed of the importance of frequent follow up visits to maximize his success with intensive lifestyle modifications for his multiple health conditions.   ATTESTASTION STATEMENTS:  Reviewed by clinician on day of visit: allergies, medications, problem list, medical history, surgical history, family history, social history, and previous encounter notes.  Bradley James. Burns Timson FNP-C

## 2023-05-21 ENCOUNTER — Ambulatory Visit (INDEPENDENT_AMBULATORY_CARE_PROVIDER_SITE_OTHER): Payer: BC Managed Care – PPO | Admitting: Internal Medicine

## 2023-05-21 ENCOUNTER — Encounter: Payer: Self-pay | Admitting: Internal Medicine

## 2023-05-21 VITALS — BP 126/74 | HR 65 | Temp 97.8°F | Resp 16 | Ht 70.0 in | Wt 225.5 lb

## 2023-05-21 DIAGNOSIS — Z0000 Encounter for general adult medical examination without abnormal findings: Secondary | ICD-10-CM

## 2023-05-21 DIAGNOSIS — R739 Hyperglycemia, unspecified: Secondary | ICD-10-CM

## 2023-05-21 NOTE — Assessment & Plan Note (Signed)
 Here for CPX Other issues: Hyperglycemia: Checking A1c Obesity: Under the care of the wellness clinic, on Qsymia. Increased LFTs: Check labs. RTC 1 year

## 2023-05-21 NOTE — Patient Instructions (Addendum)
 Vaccines I recommend: Covid booster Flu shot       GO TO THE LAB : Get the blood work     Please go to the front desk: Arrange for a physical exam in 1 year

## 2023-05-21 NOTE — Progress Notes (Signed)
 Subjective:    Patient ID: Bradley James, male    DOB: [birth year], 43 y.o.   MRN: [id number]  DOS:  05/21/2023 Type of visit - description: CPX  Here for CPX Has no symptoms or concerns.  Review of Systems   A 14 point review of systems is negative    Past Medical History:  Diagnosis Date   Back pain    Elevated LFTs    Hyperlipidemia    Lactose intolerance    Obesity     Past Surgical History:  Procedure Laterality Date   NO PAST SURGERIES     Social History   Socioeconomic History   Marital status: Married    Spouse name: Neomia Dear   Number of children: 2   Years of education: Not on file   Highest education level: Not on file  Occupational History   Occupation: Location manager     Employer: [employer]  Tobacco Use   Smoking status: Never   Smokeless tobacco: Never  Substance and Sexual Activity   Alcohol use: Yes    Comment: socially    Drug use: No   Sexual activity: Not on file  Other Topics Concern   Not on file  Social History Narrative   Original from [place], married    Children: boy Haynes Hoehn 1916, girl Elisa 2018      Social Drivers of Corporate investment banker Strain: Not on file  Food Insecurity: No Food Insecurity (07/29/2021)   Received from [employer], [facility]   Hunger Vital Sign    Worried About Running Out of Food in the Last Year: Never true    Ran Out of Food in the Last Year: Never true  Transportation Needs: Not on file  Physical Activity: Not on file  Stress: Not on file  Social Connections: Unknown (07/28/2021)   Received from [facility], [facility]   Social Network    Social Network: Not on file  Intimate Partner Violence: Unknown (07/28/2021)   Received from [facility], [facility]   HITS    Physically Hurt: Not on file    Insult or Talk Down To: Not on file    Threaten Physical Harm: Not on file    Scream or Curse: Not on file    Current Outpatient Medications  Medication Instructions   b complex vitamins  capsule 1 capsule, Daily   MAGNESIUM PO Daily   Phentermine-Topiramate (QSYMIA) 7.5-46 MG CP24 Take one po daily   Vitamin D (Ergocalciferol) (DRISDOL) 50,000 Units, Oral, Every 7 days       Objective:   Physical Exam BP 126/74   Pulse 65   Temp 97.8 F (36.6 C) (Oral)   Resp 16   Ht 5\' 10"  (1.778 m)   Wt 225 lb 8 oz (102.3 kg)   SpO2 99%   BMI 32.36 kg/m  General: Well developed, NAD, BMI noted Neck: No  thyromegaly  HEENT:  Normocephalic . Face symmetric, atraumatic Lungs:  CTA B Normal respiratory effort, no intercostal retractions, no accessory muscle use. Heart: RRR,  no murmur.  Abdomen:  Not distended, soft, non-tender. No rebound or rigidity.   Lower extremities: no pretibial edema bilaterally  Skin: Exposed areas without rash. Not pale. Not jaundice Neurologic:  alert & oriented X3.  Speech normal, gait appropriate for age and unassisted Strength symmetric and appropriate for age.  Psych: Cognition and judgment appear intact.  Cooperative with normal attention span and concentration.  Behavior appropriate. No anxious or  depressed appearing.     Assessment     ASSESSMENT Hyperglycemia: A1c 5.9 ---- 2024. Large mole, left leg, h/o (-) Bxs before Increase LFTs: Hep B and C negative 08-2014. 08-2015: Normal ceruloplasmin, iron; [place]: Fatty liver Increased TSH Vit  D def  PLAN Here for CPX - Td 02-2022. - Rec flu shot q fall and a covid booster  -CCS: start next year -labs: CMP FLP CBC A1c -Diet and exercise: Encouraged healthy diet and increase physical activity Other issues: Hyperglycemia: Checking A1c Obesity: Under the care of the wellness clinic, on Qsymia. Increased LFTs: Check labs. RTC 1 year

## 2023-05-21 NOTE — Assessment & Plan Note (Signed)
 Here for CPX - Td 02-2022. - Rec flu shot q fall and a covid booster  -CCS: start next year -labs: CMP FLP CBC A1c -Diet and exercise: Encouraged healthy diet and increase physical activity

## 2023-05-22 ENCOUNTER — Encounter: Payer: Self-pay | Admitting: Nurse Practitioner

## 2023-05-22 LAB — CBC WITH DIFFERENTIAL/PLATELET
Basophils Absolute: 0.1 10*3/uL (ref 0.0–0.1)
Basophils Relative: 1.3 % (ref 0.0–3.0)
Eosinophils Absolute: 0.3 10*3/uL (ref 0.0–0.7)
Eosinophils Relative: 5.6 % — ABNORMAL HIGH (ref 0.0–5.0)
HCT: 45.2 % (ref 39.0–52.0)
Hemoglobin: 14.9 g/dL (ref 13.0–17.0)
Lymphocytes Relative: 43.6 % (ref 12.0–46.0)
Lymphs Abs: 2.4 10*3/uL (ref 0.7–4.0)
MCHC: 33 g/dL (ref 30.0–36.0)
MCV: 90 fl (ref 78.0–100.0)
Monocytes Absolute: 0.3 10*3/uL (ref 0.1–1.0)
Monocytes Relative: 4.9 % (ref 3.0–12.0)
Neutro Abs: 2.4 10*3/uL (ref 1.4–7.7)
Neutrophils Relative %: 44.6 % (ref 43.0–77.0)
Platelets: 143 10*3/uL — ABNORMAL LOW (ref 150.0–400.0)
RBC: 5.02 Mil/uL (ref 4.22–5.81)
RDW: 14.9 % (ref 11.5–15.5)
WBC: 5.5 10*3/uL (ref 4.0–10.5)

## 2023-05-22 LAB — LIPID PANEL
Cholesterol: 174 mg/dL (ref 0–200)
HDL: 47 mg/dL (ref >39.00)
LDL Cholesterol: 96 mg/dL (ref 0–99)
NonHDL: 126.99
Total CHOL/HDL Ratio: 4
Triglycerides: 156 mg/dL — ABNORMAL HIGH (ref 0.0–149.0)
VLDL: 31.2 mg/dL (ref 0.0–40.0)

## 2023-05-22 LAB — COMPREHENSIVE METABOLIC PANEL
ALT: 23 U/L (ref 0–53)
AST: 22 U/L (ref 0–37)
Albumin: 4.4 g/dL (ref 3.5–5.2)
Alkaline Phosphatase: 51 U/L (ref 39–117)
BUN: 21 mg/dL (ref 6–23)
CO2: 26 meq/L (ref 19–32)
Calcium: 9.1 mg/dL (ref 8.4–10.5)
Chloride: 107 meq/L (ref 96–112)
Creatinine, Ser: 1.23 mg/dL (ref 0.40–1.50)
GFR: 71.59 mL/min (ref >60.00)
Glucose, Bld: 78 mg/dL (ref 70–99)
Potassium: 3.9 meq/L (ref 3.5–5.1)
Sodium: 141 meq/L (ref 135–145)
Total Bilirubin: 0.4 mg/dL (ref 0.2–1.2)
Total Protein: 6.9 g/dL (ref 6.0–8.3)

## 2023-05-22 LAB — HEMOGLOBIN A1C: Hgb A1c MFr Bld: 6 % (ref 4.6–6.5)

## 2023-05-24 ENCOUNTER — Encounter: Payer: Self-pay | Admitting: Internal Medicine

## 2023-05-24 ENCOUNTER — Other Ambulatory Visit: Payer: Self-pay | Admitting: Nurse Practitioner

## 2023-05-24 DIAGNOSIS — R632 Polyphagia: Secondary | ICD-10-CM

## 2023-05-24 DIAGNOSIS — E669 Obesity, unspecified: Secondary | ICD-10-CM

## 2023-06-01 ENCOUNTER — Other Ambulatory Visit: Payer: Self-pay | Admitting: Nurse Practitioner

## 2023-06-01 DIAGNOSIS — R632 Polyphagia: Secondary | ICD-10-CM

## 2023-06-01 DIAGNOSIS — E669 Obesity, unspecified: Secondary | ICD-10-CM

## 2023-06-19 ENCOUNTER — Ambulatory Visit (INDEPENDENT_AMBULATORY_CARE_PROVIDER_SITE_OTHER): Admitting: Nurse Practitioner

## 2023-06-19 ENCOUNTER — Encounter: Payer: Self-pay | Admitting: Nurse Practitioner

## 2023-06-19 VITALS — BP 138/76 | HR 69 | Temp 98.2°F | Ht 70.0 in | Wt 222.0 lb

## 2023-06-19 DIAGNOSIS — E66811 Obesity, class 1: Secondary | ICD-10-CM

## 2023-06-19 DIAGNOSIS — Z6831 Body mass index (BMI) 31.0-31.9, adult: Secondary | ICD-10-CM | POA: Diagnosis not present

## 2023-06-19 DIAGNOSIS — R632 Polyphagia: Secondary | ICD-10-CM | POA: Diagnosis not present

## 2023-06-19 NOTE — Progress Notes (Signed)
 Office: [phone]  /  Fax: [phone]  WEIGHT SUMMARY AND BIOMETRICS  Weight Lost Since Last Visit: 0lb  Weight Gained Since Last Visit: 0lb   Vitals Temp: 98.2 F (36.8 C) BP: 138/76 Pulse Rate: 69 SpO2: 97 %   Anthropometric Measurements Height: 5\' 10"  (1.778 m) Weight: 222 lb (100.7 kg) BMI (Calculated): 31.85 Weight at Last Visit: 222lb Weight Lost Since Last Visit: 0lb Weight Gained Since Last Visit: 0lb Starting Weight: 236lb Total Weight Loss (lbs): 14 lb (6.35 kg)   Body Composition  Body Fat %: 24.5 % Fat Mass (lbs): 54.4 lbs Muscle Mass (lbs): 159.4 lbs Total Body Water (lbs): 108.6 lbs Visceral Fat Rating : 12   Other Clinical Data Fasting: No Labs: No Today's Visit #: 14 Starting Date: 04/25/22     HPI  Chief Complaint: OBESITY  Danish is here to discuss his progress with his obesity treatment plan. He is on the the Category 4 Plan and states he is following his eating plan approximately 50 % of the time. He states he is exercising 0 minutes 0 days per week.   Interval History:  Since last office visit he has maintained his weight.  He is currently working day shift.  He has been eating out more and has been eating larger portion sizes. He is drinking water, Coke zero and juice.   He is swimming one day per week.   He is going to the beach in May and [place] in [place]  Pharmacotherapy for weight loss: He is currently taking Qsymia  dose 2 for medical weight loss (missed 1 week). Denies side effects.  Denies chest pain, SHOB or palpitations.  Notes has helped with polyphagia and cravings.  When he misses doses he does struggle with hunger.    Previous pharmacotherapy for medical weight loss:  Contrave     Bariatric surgery:  Patient has not had bariatric surgery.    PHYSICAL EXAM:  Blood pressure 138/76, pulse 69, temperature 98.2 F (36.8 C), height 5\' 10"  (1.778 m), weight 222 lb (100.7 kg), SpO2 97%. Body mass index is 31.85  kg/m.  General: He is overweight, cooperative, alert, well developed, and in no acute distress. PSYCH: Has normal mood, affect and thought process.   Extremities: No edema.  Neurologic: No gross sensory or motor deficits. No tremors or fasciculations noted.    DIAGNOSTIC DATA REVIEWED:  BMET    Component Value Date/Time   NA 141 05/21/2023 1337   NA 141 08/10/2022 1449   K 3.9 05/21/2023 1337   CL 107 05/21/2023 1337   CO2 26 05/21/2023 1337   GLUCOSE 78 05/21/2023 1337   BUN 21 05/21/2023 1337   BUN 21 08/10/2022 1449   CREATININE 1.23 05/21/2023 1337   CREATININE 1.27 12/12/2019 0832   CALCIUM 9.1 05/21/2023 1337   GFRNONAA 71.66 02/17/2010 0902   GFRAA 93 12/24/2006 0927   Lab Results  Component Value Date   HGBA1C 6.0 05/21/2023   HGBA1C 5.9 (H) 04/25/2022   Lab Results  Component Value Date   INSULIN  22.0 04/25/2022   Lab Results  Component Value Date   TSH 4.96 03/10/2022   CBC    Component Value Date/Time   WBC 5.5 05/21/2023 1337   RBC 5.02 05/21/2023 1337   HGB 14.9 05/21/2023 1337   HCT 45.2 05/21/2023 1337   PLT 143.0 (L) 05/21/2023 1337   MCV 90.0 05/21/2023 1337   MCH 29.5 12/12/2019 0832   MCHC 33.0 05/21/2023 1337   RDW  14.9 05/21/2023 1337   Iron Studies    Component Value Date/Time   IRON 64 09/24/2015 1518   TIBC 282 09/24/2015 1518   IRONPCTSAT 23 09/24/2015 1518   Lipid Panel     Component Value Date/Time   CHOL 174 05/21/2023 1337   TRIG 156.0 (H) 05/21/2023 1337   HDL 47.00 05/21/2023 1337   CHOLHDL 4 05/21/2023 1337   VLDL 31.2 05/21/2023 1337   LDLCALC 96 05/21/2023 1337   LDLCALC 121 (H) 12/12/2019 0832   LDLDIRECT 141.6 12/24/2006 0927   Hepatic Function Panel     Component Value Date/Time   PROT 6.9 05/21/2023 1337   PROT 6.9 08/10/2022 1449   ALBUMIN 4.4 05/21/2023 1337   ALBUMIN 4.6 08/10/2022 1449   AST 22 05/21/2023 1337   ALT 23 05/21/2023 1337   ALKPHOS 51 05/21/2023 1337   BILITOT 0.4 05/21/2023 1337    BILITOT 0.5 08/10/2022 1449   BILIDIR 0.0 08/08/2012 1043      Component Value Date/Time   TSH 4.96 03/10/2022 0823   Nutritional Lab Results  Component Value Date   VD25OH 39.3 08/10/2022   VD25OH 22.3 (L) 04/25/2022     ASSESSMENT AND PLAN  TREATMENT PLAN FOR OBESITY:  Recommended Dietary Goals  Rehman is currently in the action stage of change. As such, his goal is to continue weight management plan. He has agreed to the Category 4 Plan.  Behavioral Intervention  We discussed the following Behavioral Modification Strategies today: increasing lean protein intake to established goals, decreasing simple carbohydrates , increasing vegetables, increasing fiber rich foods, increasing water intake , work on meal planning and preparation, reading food labels , keeping healthy foods at home, and continue to work on maintaining a reduced calorie state, getting the recommended amount of protein, incorporating whole foods, making healthy choices, staying well hydrated and practicing mindfulness when eating..  Additional resources provided today: NA  Recommended Physical Activity Goals  Johncarlo has been advised to work up to 150 minutes of moderate intensity aerobic activity a week and strengthening exercises 2-3 times per week for cardiovascular health, weight loss maintenance and preservation of muscle mass.   He has agreed to Think about enjoyable ways to increase daily physical activity and overcoming barriers to exercise, Increase physical activity in their day and reduce sedentary time (increase NEAT)., and Work on scheduling and tracking physical activity.    Pharmacotherapy We discussed various medication options to help [place] with his weight loss efforts and we both agreed to continue Qsymia  dose 2.  Will consider increasing to dose 3. I've asked him to reach out to me via my chart in 2 weeks to let me know how he is doing.  I will send in a refill at that time.   ASSOCIATED  CONDITIONS ADDRESSED TODAY  Action/Plan  Polyphagia Continue Qsymia , consider increasing the dose.    Class 1 obesity due to excess calories without serious comorbidity with body mass index (BMI) of 31.0 to 31.9 in adult         Return in about 4 weeks (around 07/17/2023).Aaron Aas He was informed of the importance of frequent follow up visits to maximize his success with intensive lifestyle modifications for his multiple health conditions.   ATTESTASTION STATEMENTS:  Reviewed by clinician on day of visit: allergies, medications, problem list, medical history, surgical history, family history, social history, and previous encounter notes.   Time spent on visit including pre-visit chart review and post-visit care and charting was 30 minutes discussing  nutrition, exercise and Qsymia .    Crist Dominion. Odell Choung FNP-C

## 2023-07-17 ENCOUNTER — Ambulatory Visit (INDEPENDENT_AMBULATORY_CARE_PROVIDER_SITE_OTHER): Admitting: Nurse Practitioner

## 2023-07-17 ENCOUNTER — Encounter: Payer: Self-pay | Admitting: Nurse Practitioner

## 2023-07-17 DIAGNOSIS — R632 Polyphagia: Secondary | ICD-10-CM | POA: Diagnosis not present

## 2023-07-17 DIAGNOSIS — E669 Obesity, unspecified: Secondary | ICD-10-CM

## 2023-07-17 DIAGNOSIS — Z6831 Body mass index (BMI) 31.0-31.9, adult: Secondary | ICD-10-CM | POA: Diagnosis not present

## 2023-07-17 MED ORDER — PHENTERMINE-TOPIRAMATE ER 7.5-46 MG PO CP24: ORAL_CAPSULE | ORAL | 0 refills | Status: DC

## 2023-07-17 NOTE — Progress Notes (Signed)
 Office: [phone]  /  Fax: [phone]  WEIGHT SUMMARY AND BIOMETRICS  Weight Lost Since Last Visit: 1lb  Weight Gained Since Last Visit: 0lb   Vitals Temp: 97.8 F (36.6 C) BP: 123/81 Pulse Rate: 74 SpO2: 98 %   Anthropometric Measurements Height: 5\' 10"  (1.778 m) Weight: 221 lb (100.2 kg) BMI (Calculated): 31.71 Weight at Last Visit: 222lb Weight Lost Since Last Visit: 1lb Weight Gained Since Last Visit: 0lb Starting Weight: 236lb Total Weight Loss (lbs): 15 lb (6.804 kg)   Body Composition  Body Fat %: 23.8 % Fat Mass (lbs): 52.6 lbs Muscle Mass (lbs): 160.6 lbs Total Body Water (lbs): 107 lbs Visceral Fat Rating : 11   Other Clinical Data Fasting: No Labs: No Today's Visit #: 15 Starting Date: 04/25/22     HPI  Chief Complaint: OBESITY  Bradley James is here to discuss his progress with his obesity treatment plan. He is on the the Category 4 Plan and states he is following his eating plan approximately 50 % of the time. He states he is exercising 30 minutes 2 days per week.   Interval History:  Since last office visit he has lost 1 pound.  He went on vacation since his last visit. His mother cooks for him daily.  He is trying to make healthier choices. His eats larger portion sizes. He struggles with motivation.  He is walking to stay active.   Pharmacotherapy for weight loss: He is currently taking Qsymia  dose 2 for medical weight loss. Denies side effects.  Denies chest pain, SHOB or palpitations.  Notes has helped with polyphagia and cravings.  He sometimes misses taking Qsymia  due to work schedule. He didn't take while he was on vacation.  When he misses doses, he struggles with hunger.    Unable to take GLP 1s due to cost  Previous pharmacotherapy for medical weight loss:  Contrave  -didn't feel "good" while taking it.  Felt bad-depression.      Bariatric surgery:  Patient has not had bariatric surgery.   PHYSICAL EXAM:  Blood pressure 123/81,  pulse 74, temperature 97.8 F (36.6 C), height 5\' 10"  (1.778 m), weight 221 lb (100.2 kg), SpO2 98%. Body mass index is 31.71 kg/m.  General: He is overweight, cooperative, alert, well developed, and in no acute distress. PSYCH: Has normal mood, affect and thought process.   Extremities: No edema.  Neurologic: No gross sensory or motor deficits. No tremors or fasciculations noted.    DIAGNOSTIC DATA REVIEWED:  BMET    Component Value Date/Time   NA 141 05/21/2023 1337   NA 141 08/10/2022 1449   K 3.9 05/21/2023 1337   CL 107 05/21/2023 1337   CO2 26 05/21/2023 1337   GLUCOSE 78 05/21/2023 1337   BUN 21 05/21/2023 1337   BUN 21 08/10/2022 1449   CREATININE 1.23 05/21/2023 1337   CREATININE 1.27 12/12/2019 0832   CALCIUM 9.1 05/21/2023 1337   GFRNONAA 71.66 02/17/2010 0902   GFRAA 93 12/24/2006 0927   Lab Results  Component Value Date   HGBA1C 6.0 05/21/2023   HGBA1C 5.9 (H) 04/25/2022   Lab Results  Component Value Date   INSULIN  22.0 04/25/2022   Lab Results  Component Value Date   TSH 4.96 03/10/2022   CBC    Component Value Date/Time   WBC 5.5 05/21/2023 1337   RBC 5.02 05/21/2023 1337   HGB 14.9 05/21/2023 1337   HCT 45.2 05/21/2023 1337   PLT 143.0 (L) 05/21/2023 1337  MCV 90.0 05/21/2023 1337   MCH 29.5 12/12/2019 0832   MCHC 33.0 05/21/2023 1337   RDW 14.9 05/21/2023 1337   Iron Studies    Component Value Date/Time   IRON 64 09/24/2015 1518   TIBC 282 09/24/2015 1518   IRONPCTSAT 23 09/24/2015 1518   Lipid Panel     Component Value Date/Time   CHOL 174 05/21/2023 1337   TRIG 156.0 (H) 05/21/2023 1337   HDL 47.00 05/21/2023 1337   CHOLHDL 4 05/21/2023 1337   VLDL 31.2 05/21/2023 1337   LDLCALC 96 05/21/2023 1337   LDLCALC 121 (H) 12/12/2019 0832   LDLDIRECT 141.6 12/24/2006 0927   Hepatic Function Panel     Component Value Date/Time   PROT 6.9 05/21/2023 1337   PROT 6.9 08/10/2022 1449   ALBUMIN 4.4 05/21/2023 1337   ALBUMIN 4.6  08/10/2022 1449   AST 22 05/21/2023 1337   ALT 23 05/21/2023 1337   ALKPHOS 51 05/21/2023 1337   BILITOT 0.4 05/21/2023 1337   BILITOT 0.5 08/10/2022 1449   BILIDIR 0.0 08/08/2012 1043      Component Value Date/Time   TSH 4.96 03/10/2022 0823   Nutritional Lab Results  Component Value Date   VD25OH 39.3 08/10/2022   VD25OH 22.3 (L) 04/25/2022     ASSESSMENT AND PLAN  TREATMENT PLAN FOR OBESITY:  Recommended Dietary Goals  Bradley James is currently in the action stage of change. As such, his goal is to continue weight management plan. He has agreed to keeping a food journal and adhering to recommended goals of 1700-1800 calories and 100+ grams protein.  Encouraged to track.    Behavioral Intervention  We discussed the following Behavioral Modification Strategies today: increasing lean protein intake to established goals, decreasing simple carbohydrates , increasing vegetables, increasing fiber rich foods, increasing water intake , and continue to work on maintaining a reduced calorie state, getting the recommended amount of protein, incorporating whole foods, making healthy choices, staying well hydrated and practicing mindfulness when eating..  Additional resources provided today: NA  Recommended Physical Activity Goals  Bradley James has been advised to work up to 150 minutes of moderate intensity aerobic activity a week and strengthening exercises 2-3 times per week for cardiovascular health, weight loss maintenance and preservation of muscle mass.   He has agreed to Think about enjoyable ways to increase daily physical activity and overcoming barriers to exercise, Increase physical activity in their day and reduce sedentary time (increase NEAT)., Increase the intensity, frequency or duration of strengthening exercises , Increase the intensity, frequency or duration of aerobic exercises  , and Work on scheduling and tracking physical activity.    Pharmacotherapy We discussed various  medication options to help Bradley James with his weight loss efforts and we both agreed to continue Qsymia  dose 2.  Side effects discussed.  Unable to take GLP 1s due to cost. Avoid Contrave  due to side effects.   ASSOCIATED CONDITIONS ADDRESSED TODAY  Action/Plan  Polyphagia -     Phentermine-Topiramate; Take one po daily  Dispense: 30 capsule; Refill: 0  Generalized obesity -     Phentermine-Topiramate; Take one po daily  Dispense: 30 capsule; Refill: 0  Plans to discuss exercising with his wife-hopefully if she works out with him, it will help with his motivation.    Will obtain labs at next visit-Vit d, TSH   Return in about 4 weeks (around 08/14/2023).Bradley James He was informed of the importance of frequent follow up visits to maximize his success with intensive lifestyle  modifications for his multiple health conditions.   ATTESTASTION STATEMENTS:  Reviewed by clinician on day of visit: allergies, medications, problem list, medical history, surgical history, family history, social history, and previous encounter notes.   Crist Dominion. Nickoles Gregori FNP-C

## 2023-08-28 ENCOUNTER — Ambulatory Visit (INDEPENDENT_AMBULATORY_CARE_PROVIDER_SITE_OTHER): Admitting: Nurse Practitioner

## 2023-08-28 ENCOUNTER — Encounter: Payer: Self-pay | Admitting: Nurse Practitioner

## 2023-08-28 VITALS — BP 122/82 | HR 74 | Temp 98.1°F | Ht 70.0 in | Wt 224.0 lb

## 2023-08-28 DIAGNOSIS — E66811 Obesity, class 1: Secondary | ICD-10-CM | POA: Diagnosis not present

## 2023-08-28 DIAGNOSIS — E559 Vitamin D deficiency, unspecified: Secondary | ICD-10-CM | POA: Diagnosis not present

## 2023-08-28 DIAGNOSIS — R632 Polyphagia: Secondary | ICD-10-CM

## 2023-08-28 DIAGNOSIS — Z6832 Body mass index (BMI) 32.0-32.9, adult: Secondary | ICD-10-CM

## 2023-08-28 DIAGNOSIS — R7303 Prediabetes: Secondary | ICD-10-CM

## 2023-08-28 DIAGNOSIS — Z79899 Other long term (current) drug therapy: Secondary | ICD-10-CM

## 2023-08-28 MED ORDER — PHENTERMINE-TOPIRAMATE ER 7.5-46 MG PO CP24: ORAL_CAPSULE | ORAL | 0 refills | Status: DC

## 2023-08-28 MED ORDER — VITAMIN D (ERGOCALCIFEROL) 1.25 MG (50000 UNIT) PO CAPS: 50000.0000 [IU] | ORAL_CAPSULE | ORAL | 0 refills | Status: DC

## 2023-08-28 NOTE — Progress Notes (Signed)
 Office: [phone]  /  Fax: [phone]  WEIGHT SUMMARY AND BIOMETRICS  Weight Lost Since Last Visit: 0lb  Weight Gained Since Last Visit: 2lb   Vitals Temp: 98.1 F (36.7 C) BP: 122/82 Pulse Rate: 74 SpO2: 96 %   Anthropometric Measurements Height: 5' 10 (1.778 m) Weight: 224 lb (101.6 kg) BMI (Calculated): 32.14 Weight at Last Visit: 222lb Weight Lost Since Last Visit: 0lb Weight Gained Since Last Visit: 2lb Starting Weight: 236lb Total Weight Loss (lbs): 13 lb (5.897 kg)   Body Composition  Body Fat %: 25 % Fat Mass (lbs): 56 lbs Muscle Mass (lbs): 159.8 lbs Total Body Water (lbs): 108.4 lbs Visceral Fat Rating : 12   Other Clinical Data Fasting: No Labs: Yes Today's Visit #: 16 Starting Date: 04/25/22     HPI  Chief Complaint: OBESITY  Bradley James is here to discuss his progress with his obesity treatment plan. He is on the the Category 4 Plan and states he is following his eating plan approximately 50 % of the time. He states he is exercising 30 minutes 5 days per week.   Interval History:  Since last office visit he has gained 2 pounds. He is eating 3 meals per day.  He eats more fast foods when he works night shift.  He is currently working night shift and is going to first shift next week. He works 10 weeks nights and 10 weeks days.  He is drinking water, pink lemonade and diet soda.  He is walking and playing with his children to stay active.    Pharmacotherapy for weight loss: He has been taking Qsymia  dose 2 for medical weight loss (hasn't taken in the past 2 weeks-he didn't pick up the last prescription for 2 weeks so it was cancelled at the pharmacy). Denies side effects.  Denies chest pain, SHOB or palpitations.  Notes has helped with polyphagia and cravings when he was taking it on a regular basis.  Since he has been off, his polyphagia and cravings have gotten worse.   Unable to take GLP 1s due to cost   Previous pharmacotherapy for medical  weight loss:  Contrave  -didn't feel good while taking it.  Felt bad-depression.      Bariatric surgery:  Patient has not had bariatric surgery.   Vit D deficiency  He is not taking Vit D 50,000 IU weekly.    Lab Results  Component Value Date   VD25OH 39.3 08/10/2022   VD25OH 22.3 (L) 04/25/2022    Prediabetes Last A1c was 6.0  Medication(s): None-has taken Metformin  in the past and doesn't want to restart taking it.   Polyphagia:Yes Lab Results  Component Value Date   HGBA1C 6.0 05/21/2023   HGBA1C 5.7 (H) 08/10/2022   HGBA1C 5.9 (H) 04/25/2022   Lab Results  Component Value Date   INSULIN  22.0 04/25/2022     PHYSICAL EXAM:  Blood pressure 122/82, pulse 74, temperature 98.1 F (36.7 C), height 5' 10 (1.778 m), weight 224 lb (101.6 kg), SpO2 96%. Body mass index is 32.14 kg/m.  General: He is overweight, cooperative, alert, well developed, and in no acute distress. PSYCH: Has normal mood, affect and thought process.   Extremities: No edema.  Neurologic: No gross sensory or motor deficits. No tremors or fasciculations noted.    DIAGNOSTIC DATA REVIEWED:  BMET    Component Value Date/Time   NA 141 05/21/2023 1337   NA 141 08/10/2022 1449   K 3.9 05/21/2023 1337   CL  107 05/21/2023 1337   CO2 26 05/21/2023 1337   GLUCOSE 78 05/21/2023 1337   BUN 21 05/21/2023 1337   BUN 21 08/10/2022 1449   CREATININE 1.23 05/21/2023 1337   CREATININE 1.27 12/12/2019 0832   CALCIUM 9.1 05/21/2023 1337   GFRNONAA 71.66 02/17/2010 0902   GFRAA 93 12/24/2006 0927   Lab Results  Component Value Date   HGBA1C 6.0 05/21/2023   HGBA1C 5.9 (H) 04/25/2022   Lab Results  Component Value Date   INSULIN  22.0 04/25/2022   Lab Results  Component Value Date   TSH 4.96 03/10/2022   CBC    Component Value Date/Time   WBC 5.5 05/21/2023 1337   RBC 5.02 05/21/2023 1337   HGB 14.9 05/21/2023 1337   HCT 45.2 05/21/2023 1337   PLT 143.0 (L) 05/21/2023 1337   MCV 90.0  05/21/2023 1337   MCH 29.5 12/12/2019 0832   MCHC 33.0 05/21/2023 1337   RDW 14.9 05/21/2023 1337   Iron Studies    Component Value Date/Time   IRON 64 09/24/2015 1518   TIBC 282 09/24/2015 1518   IRONPCTSAT 23 09/24/2015 1518   Lipid Panel     Component Value Date/Time   CHOL 174 05/21/2023 1337   TRIG 156.0 (H) 05/21/2023 1337   HDL 47.00 05/21/2023 1337   CHOLHDL 4 05/21/2023 1337   VLDL 31.2 05/21/2023 1337   LDLCALC 96 05/21/2023 1337   LDLCALC 121 (H) 12/12/2019 0832   LDLDIRECT 141.6 12/24/2006 0927   Hepatic Function Panel     Component Value Date/Time   PROT 6.9 05/21/2023 1337   PROT 6.9 08/10/2022 1449   ALBUMIN 4.4 05/21/2023 1337   ALBUMIN 4.6 08/10/2022 1449   AST 22 05/21/2023 1337   ALT 23 05/21/2023 1337   ALKPHOS 51 05/21/2023 1337   BILITOT 0.4 05/21/2023 1337   BILITOT 0.5 08/10/2022 1449   BILIDIR 0.0 08/08/2012 1043      Component Value Date/Time   TSH 4.96 03/10/2022 0823   Nutritional Lab Results  Component Value Date   VD25OH 39.3 08/10/2022   VD25OH 22.3 (L) 04/25/2022     ASSESSMENT AND PLAN  TREATMENT PLAN FOR OBESITY:  Recommended Dietary Goals  Bradley James is currently in the action stage of change. As such, his goal is to continue weight management plan. He has agreed to the Category 4 Plan.  Behavioral Intervention  We discussed the following Behavioral Modification Strategies today: increasing lean protein intake to established goals, decreasing simple carbohydrates , increasing vegetables, increasing fiber rich foods, increasing water intake , work on meal planning and preparation, work on tracking and journaling calories using tracking application, reading food labels , keeping healthy foods at home, and continue to work on maintaining a reduced calorie state, getting the recommended amount of protein, incorporating whole foods, making healthy choices, staying well hydrated and practicing mindfulness when eating..  Additional  resources provided today: Dining out guide  Recommended Physical Activity Goals  Bradley James has been advised to work up to 150 minutes of moderate intensity aerobic activity a week and strengthening exercises 2-3 times per week for cardiovascular health, weight loss maintenance and preservation of muscle mass.   He has agreed to Think about enjoyable ways to increase daily physical activity and overcoming barriers to exercise, Increase physical activity in their day and reduce sedentary time (increase NEAT)., and continue to gradually increase the amount and intensity of exercise routine   Pharmacotherapy We discussed various medication options to help Bradley James with his  weight loss efforts and we both agreed to restart Qsymia  dose 2. Side effects discussed.  ASSOCIATED CONDITIONS ADDRESSED TODAY  Action/Plan  Polyphagia -     Phentermine -Topiramate  ER; Take one po daily  Dispense: 30 capsule; Refill: 0  Vitamin D  deficiency -     VITAMIN D  25 Hydroxy (Vit-D Deficiency, Fractures) -     Vitamin D  (Ergocalciferol ); Take 1 capsule (50,000 Units total) by mouth every 7 (seven) days.  Dispense: 5 capsule; Refill: 0  Pre-diabetes -     Hemoglobin A1c  Will discuss next best plan of care after lab results Reconsider retrying Metformin .  Hasn't been interested in retrying.    Medication management -     Comprehensive metabolic panel with GFR -     TSH  Obesity, Class I, BMI 30-34.9 -     TSH -     Restart Phentermine -Topiramate  ER; Take one po daily  Dispense: 30 capsule; Refill: 0. Side effects discussed.          Return in about 4 weeks (around 09/25/2023).SABRA He was informed of the importance of frequent follow up visits to maximize his success with intensive lifestyle modifications for his multiple health conditions.   ATTESTASTION STATEMENTS:  Reviewed by clinician on day of visit: allergies, medications, problem list, medical history, surgical history, family history, social history,  and previous encounter notes.     Bradley James. Bradley Noack FNP-C

## 2023-08-29 LAB — HEMOGLOBIN A1C
Est. average glucose Bld gHb Est-mCnc: 117 mg/dL
Hgb A1c MFr Bld: 5.7 % — ABNORMAL HIGH (ref 4.8–5.6)

## 2023-08-29 LAB — COMPREHENSIVE METABOLIC PANEL WITH GFR
ALT: 29 IU/L (ref 0–44)
AST: 29 IU/L (ref 0–40)
Albumin: 4.4 g/dL (ref 4.1–5.1)
Alkaline Phosphatase: 57 IU/L (ref 44–121)
BUN/Creatinine Ratio: 12 (ref 9–20)
BUN: 14 mg/dL (ref 6–24)
Bilirubin Total: 0.4 mg/dL (ref 0.0–1.2)
CO2: 21 mmol/L (ref 20–29)
Calcium: 9 mg/dL (ref 8.7–10.2)
Chloride: 103 mmol/L (ref 96–106)
Creatinine, Ser: 1.15 mg/dL (ref 0.76–1.27)
Globulin, Total: 2.4 g/dL (ref 1.5–4.5)
Glucose: 83 mg/dL (ref 70–99)
Potassium: 4.3 mmol/L (ref 3.5–5.2)
Sodium: 141 mmol/L (ref 134–144)
Total Protein: 6.8 g/dL (ref 6.0–8.5)
eGFR: 80 mL/min/{1.73_m2} (ref >59)

## 2023-08-29 LAB — SPECIMEN STATUS REPORT

## 2023-08-29 LAB — TSH: TSH: 4.59 u[IU]/mL — ABNORMAL HIGH (ref 0.450–4.500)

## 2023-08-29 LAB — VITAMIN D 25 HYDROXY (VIT D DEFICIENCY, FRACTURES): Vit D, 25-Hydroxy: 26.1 ng/mL — ABNORMAL LOW (ref 30.0–100.0)

## 2023-10-09 ENCOUNTER — Other Ambulatory Visit (HOSPITAL_COMMUNITY): Payer: Self-pay

## 2023-10-09 ENCOUNTER — Encounter: Payer: Self-pay | Admitting: Nurse Practitioner

## 2023-10-09 ENCOUNTER — Ambulatory Visit (INDEPENDENT_AMBULATORY_CARE_PROVIDER_SITE_OTHER): Admitting: Nurse Practitioner

## 2023-10-09 VITALS — BP 111/76 | HR 72 | Temp 98.2°F | Ht 70.0 in | Wt 224.0 lb

## 2023-10-09 DIAGNOSIS — Z6832 Body mass index (BMI) 32.0-32.9, adult: Secondary | ICD-10-CM

## 2023-10-09 DIAGNOSIS — E66811 Obesity, class 1: Secondary | ICD-10-CM

## 2023-10-09 DIAGNOSIS — R7989 Other specified abnormal findings of blood chemistry: Secondary | ICD-10-CM | POA: Diagnosis not present

## 2023-10-09 DIAGNOSIS — R7303 Prediabetes: Secondary | ICD-10-CM

## 2023-10-09 DIAGNOSIS — E559 Vitamin D deficiency, unspecified: Secondary | ICD-10-CM

## 2023-10-09 DIAGNOSIS — R632 Polyphagia: Secondary | ICD-10-CM

## 2023-10-09 MED ORDER — PHENTERMINE-TOPIRAMATE ER 7.5-46 MG PO CP24: ORAL_CAPSULE | ORAL | 0 refills | Status: DC

## 2023-10-09 MED ORDER — VITAMIN D (ERGOCALCIFEROL) 1.25 MG (50000 UNIT) PO CAPS: 50000.0000 [IU] | ORAL_CAPSULE | ORAL | 0 refills | Status: DC

## 2023-10-09 MED ORDER — PHENTERMINE-TOPIRAMATE ER 7.5-46 MG PO CP24
1.0000 | ORAL_CAPSULE | Freq: Every day | ORAL | 0 refills | Status: DC
  Filled 2023-10-09: qty 30, 30d supply, fill #0

## 2023-10-09 NOTE — Progress Notes (Signed)
 Office: [phone]  /  Fax: [phone]  WEIGHT SUMMARY AND BIOMETRICS  Weight Lost Since Last Visit: 0lb  Weight Gained Since Last Visit: 0lb   Vitals Temp: 98.2 F (36.8 C) BP: 111/76 Pulse Rate: 72 SpO2: 96 %   Anthropometric Measurements Height: 5' 10 (1.778 m) Weight: 224 lb (101.6 kg) BMI (Calculated): 32.14 Weight at Last Visit: 224lb Weight Lost Since Last Visit: 0lb Weight Gained Since Last Visit: 0lb Starting Weight: 236lb Total Weight Loss (lbs): 13 lb (5.897 kg)   Body Composition  Body Fat %: 24.5 % Fat Mass (lbs): 54.8 lbs Muscle Mass (lbs): 160.8 lbs Total Body Water (lbs): 109.4 lbs Visceral Fat Rating : 12   Other Clinical Data Fasting: No Labs: No Today's Visit #: 17 Starting Date: 04/25/22     HPI  Chief Complaint: OBESITY  Lonzo is here to discuss his progress with his obesity treatment plan. He is on the the Category 4 Plan and states he is following his eating plan approximately 50 % of the time. He states he is exercising 0 minutes 0 days per week.   Interval History:  Since last office visit he has maintained his weight.  He is going to [place] the end of the month.  He notes that he has gotten off track.  He hasn't been following his meal plan or taking Qsymia  until the last 2 weeks.  He is drinking water and several sugary drinks daily.     Pharmacotherapy for weight loss: He started taking Qsymia  dose 2 last week. Denies side effects.    Unable to take GLP 1s due to cost   Previous pharmacotherapy for medical weight loss:  Contrave  -didn't feel good while taking it.  Felt bad-depression.      Bariatric surgery:  Patient has not had bariatric surgery.   Abnormal TSH Last TSH was 4.590. Reports fatigue.  Has had a couple TSH's out of range over the past 7 years.    Vit D deficiency  He is taking Vit D 50,000 IU weekly-not taking on a regular basis.  Denies side effects.  Denies nausea, vomiting or muscle  weakness.    Lab Results  Component Value Date   VD25OH 26.1 (L) 08/28/2023   VD25OH 39.3 08/10/2022   VD25OH 22.3 (L) 04/25/2022    Prediabetes Last A1c was 5.7  Medication(s): none-has taken Metformin  in the past and doesn't want to restart taking it at this time.   Polyphagia:Yes Lab Results  Component Value Date   HGBA1C 5.7 (H) 08/28/2023   HGBA1C 6.0 05/21/2023   HGBA1C 5.7 (H) 08/10/2022   HGBA1C 5.9 (H) 04/25/2022   Lab Results  Component Value Date   INSULIN  22.0 04/25/2022     PHYSICAL EXAM:  Blood pressure 111/76, pulse 72, temperature 98.2 F (36.8 C), height 5' 10 (1.778 m), weight 224 lb (101.6 kg), SpO2 96%. Body mass index is 32.14 kg/m.  General: He is overweight, cooperative, alert, well developed, and in no acute distress. PSYCH: Has normal mood, affect and thought process.   Extremities: No edema.  Neurologic: No gross sensory or motor deficits. No tremors or fasciculations noted.    DIAGNOSTIC DATA REVIEWED:  BMET    Component Value Date/Time   NA 141 08/28/2023 0000   K 4.3 08/28/2023 0000   CL 103 08/28/2023 0000   CO2 21 08/28/2023 0000   GLUCOSE 83 08/28/2023 0000   GLUCOSE 78 05/21/2023 1337   BUN 14 08/28/2023 0000  CREATININE 1.15 08/28/2023 0000   CREATININE 1.27 12/12/2019 0832   CALCIUM 9.0 08/28/2023 0000   GFRNONAA 71.66 02/17/2010 0902   GFRAA 93 12/24/2006 0927   Lab Results  Component Value Date   HGBA1C 5.7 (H) 08/28/2023   HGBA1C 5.9 (H) 04/25/2022   Lab Results  Component Value Date   INSULIN  22.0 04/25/2022   Lab Results  Component Value Date   TSH 4.590 (H) 08/28/2023   CBC    Component Value Date/Time   WBC 5.5 05/21/2023 1337   RBC 5.02 05/21/2023 1337   HGB 14.9 05/21/2023 1337   HCT 45.2 05/21/2023 1337   PLT 143.0 (L) 05/21/2023 1337   MCV 90.0 05/21/2023 1337   MCH 29.5 12/12/2019 0832   MCHC 33.0 05/21/2023 1337   RDW 14.9 05/21/2023 1337   Iron Studies    Component Value Date/Time    IRON 64 09/24/2015 1518   TIBC 282 09/24/2015 1518   IRONPCTSAT 23 09/24/2015 1518   Lipid Panel     Component Value Date/Time   CHOL 174 05/21/2023 1337   TRIG 156.0 (H) 05/21/2023 1337   HDL 47.00 05/21/2023 1337   CHOLHDL 4 05/21/2023 1337   VLDL 31.2 05/21/2023 1337   LDLCALC 96 05/21/2023 1337   LDLCALC 121 (H) 12/12/2019 0832   LDLDIRECT 141.6 12/24/2006 0927   Hepatic Function Panel     Component Value Date/Time   PROT 6.8 08/28/2023 0000   ALBUMIN 4.4 08/28/2023 0000   AST 29 08/28/2023 0000   ALT 29 08/28/2023 0000   ALKPHOS 57 08/28/2023 0000   BILITOT 0.4 08/28/2023 0000   BILIDIR 0.0 08/08/2012 1043      Component Value Date/Time   TSH 4.590 (H) 08/28/2023 0000   Nutritional Lab Results  Component Value Date   VD25OH 26.1 (L) 08/28/2023   VD25OH 39.3 08/10/2022   VD25OH 22.3 (L) 04/25/2022     ASSESSMENT AND PLAN  TREATMENT PLAN FOR OBESITY:  Recommended Dietary Goals  Jaheem is currently in the action stage of change. As such, his goal is to continue weight management plan. He has agreed to the Category 4 Plan.  Behavioral Intervention  We discussed the following Behavioral Modification Strategies today: increasing lean protein intake to established goals, decreasing simple carbohydrates , increasing vegetables, increasing fiber rich foods, increasing water intake , work on meal planning and preparation, reading food labels , keeping healthy foods at home, identifying sources and decreasing liquid calories, and continue to work on maintaining a reduced calorie state, getting the recommended amount of protein, incorporating whole foods, making healthy choices, staying well hydrated and practicing mindfulness when eating..  Additional resources provided today: NA  Recommended Physical Activity Goals  Asaad has been advised to work up to 150 minutes of moderate intensity aerobic activity a week and strengthening exercises 2-3 times per week for  cardiovascular health, weight loss maintenance and preservation of muscle mass.   He has agreed to Think about enjoyable ways to increase daily physical activity and overcoming barriers to exercise, Increase physical activity in their day and reduce sedentary time (increase NEAT)., and Work on scheduling and tracking physical activity.    Pharmacotherapy We discussed various medication options to help [place] with his weight loss efforts and we both agreed to stop Qsymia  and start generic Qsymia  dose 2. Side effects discussed.  To take on a consistent basis.  ASSOCIATED CONDITIONS ADDRESSED TODAY  Action/Plan  Pre-diabetes Patient is not interested in starting Metformin  at this time  Vitamin D  deficiency -     Vitamin D  (Ergocalciferol ); Take 1 capsule (50,000 Units total) by mouth every 7 (seven) days.  Dispense: 5 capsule; Refill: 0. Side effects discussed  To take on as directed  Abnormal TSH -     TSH -     T4, free -     T3  Polyphagia -     Phentermine -Topiramate  ER; Take one po daily  Dispense: 30 capsule; Refill: 0  Obesity, Class I, BMI 30-34.9 -     Phentermine -Topiramate  ER; Take one po daily  Dispense: 30 capsule; Refill: 0      Labs reviewed in chart with patient from 08/28/23   Return in about 4 weeks (around 11/06/2023).SABRA He was informed of the importance of frequent follow up visits to maximize his success with intensive lifestyle modifications for his multiple health conditions.   ATTESTASTION STATEMENTS:  Reviewed by clinician on day of visit: allergies, medications, problem list, medical history, surgical history, family history, social history, and previous encounter notes.     Corean SAUNDERS. Madlynn Lundeen FNP-C

## 2023-10-09 NOTE — Addendum Note (Signed)
 Addended by: Jacquiline Zurcher on: 10/09/2023 02:35 PM   Modules accepted: Orders

## 2023-10-10 ENCOUNTER — Ambulatory Visit: Payer: Self-pay | Admitting: Nurse Practitioner

## 2023-10-10 LAB — TSH: TSH: 3.74 u[IU]/mL (ref 0.450–4.500)

## 2023-10-10 LAB — T3: T3, Total: 114 ng/dL (ref 71–180)

## 2023-10-10 LAB — T4, FREE: Free T4: 0.84 ng/dL (ref 0.82–1.77)

## 2023-11-06 ENCOUNTER — Ambulatory Visit (INDEPENDENT_AMBULATORY_CARE_PROVIDER_SITE_OTHER): Admitting: Medical

## 2023-11-06 VITALS — BP 118/76 | HR 76 | Temp 98.1°F | Resp 15 | Ht 70.0 in | Wt 228.8 lb

## 2023-11-06 DIAGNOSIS — L089 Local infection of the skin and subcutaneous tissue, unspecified: Secondary | ICD-10-CM

## 2023-11-06 MED ORDER — CEFTRIAXONE SODIUM 1 G IJ SOLR
1.0000 g | Freq: Once | INTRAMUSCULAR | Status: AC
  Administered 2023-11-06: 1 g via INTRAMUSCULAR

## 2023-11-06 MED ORDER — DOXYCYCLINE HYCLATE 100 MG PO TABS: 100.0000 mg | ORAL_TABLET | Freq: Two times a day (BID) | ORAL | 0 refills | Status: AC

## 2023-11-06 NOTE — Patient Instructions (Signed)
 Skin infection of left lower leg below knee(concern for early abscess vs cellulitis) Improving with treatment, no systemic symptoms present. - Administered Rocephin  injection. - Prescribed doxycycline  100 mg orally twice daily for 10 days. Advised to take with food and cautioned about photosensitivity. - Instructed on warm saline soaks with Epsom salt twice daily for 8-10 minutes. - Ordered wound culture. - Advised to seek emergency care if area size increases or inflammation worsens.  Follow-Up Follow-up necessary to monitor if cellulitis or over abscess forms - Scheduled follow-up appointment for Friday at 8:20 AM.

## 2023-11-06 NOTE — Progress Notes (Signed)
   Subjective:    Patient ID: Bradley James, male    DOB: 11/10/1980, 43 y.o.   MRN: [id number]  HPI MERWYN HODAPP is a 43 year old male who presents with an inflamed area below the knee following a scratch sustained in [place]  He developed a small white bump below his knee after scratching it while in [place] The initial lesion, described as a 'puntico blanco', was scratched further, leading to inflammation. Continued swimming in the ocean may have contributed to the condition.  He sought medical attention in [place] when the area became more inflamed. He was prescribed an oral antibiotic, which he completed, and received an injection for pain and an antibiotic injection. The oral antibiotic course lasted seven days, but he was unable to obtain more due to pharmacy limitations.  Currently, the area remains inflamed but has improved since the initial treatment. The swelling is less than before, and the pain is present but tolerable. No fever, chills, or sweating. He also reports no joint pain in the knee itself, only in the area below the knee.   Review of Systems  Constitutional:  Negative for chills, fatigue and fever.  Musculoskeletal:        See hpi  Skin:        See hpi       Objective:   Physical Exam  General Mental Status- Alert. General Appearance- Not in acute distress.    Chest and Lung Exam Auscultation: Breath Sounds:-Normal.  Cardiovascular Auscultation:Rythm- Regular. Murmurs & Other Heart Sounds:Auscultation of the heart reveals- No Murmurs.    Neurologic Cranial Nerve exam:- CN III-XII intact(No nystagmus), symmetric smile. Strength:- 5/5 equal and symmetric strength both upper and lower extremities.   left knee pretibial area- Inflammation below the knee area about 2.5 cm wide x 2.5 cm mild swelling. Small pinpoint opening on the skin center area. Small white serosanguinous drainage. left knee- no swollen and full rom.  left calf- not swollen      Assessment & Plan:   Patient Instructions  Skin infection of left lower leg below knee(concern for early abscess vs cellulitis) Improving with treatment, no systemic symptoms present. - Administered Rocephin  injection. - Prescribed doxycycline  100 mg orally twice daily for 10 days. Advised to take with food and cautioned about photosensitivity. - Instructed on warm saline soaks with Epsom salt twice daily for 8-10 minutes. - Ordered wound culture. - Advised to seek emergency care if area size increases or inflammation worsens.  Follow-Up Follow-up necessary to monitor if cellulitis or over abscess forms - Scheduled follow-up appointment for Friday at 8:20 AM.   Ardis Fullwood, PA-C

## 2023-11-07 ENCOUNTER — Ambulatory Visit: Payer: Self-pay | Admitting: Medical

## 2023-11-08 NOTE — Progress Notes (Signed)
 Pt has apt Friday to discuss results

## 2023-11-09 ENCOUNTER — Ambulatory Visit (INDEPENDENT_AMBULATORY_CARE_PROVIDER_SITE_OTHER): Admitting: Medical

## 2023-11-09 VITALS — BP 116/80 | HR 80 | Temp 98.3°F | Resp 16 | Ht 70.0 in | Wt 227.2 lb

## 2023-11-09 DIAGNOSIS — L089 Local infection of the skin and subcutaneous tissue, unspecified: Secondary | ICD-10-CM | POA: Diagnosis not present

## 2023-11-09 LAB — WOUND CULTURE
MICRO NUMBER:: 16942921
SPECIMEN QUALITY:: ADEQUATE

## 2023-11-09 NOTE — Progress Notes (Signed)
   Subjective:    Patient ID: Bradley James, male    DOB: 02-05-1981, 43 y.o.   MRN: [id number]  HPI Bradley James is a 43 year old male who presents for follow-up of a left medial calf skin infection.  He developed a skin infection on the left medial calf while in [place] approximately ten days prior to this visit. Initially treated with Augmentin, he experienced some improvement, but the infection persisted.  He was subsequently prescribed doxycycline . He is currently on the third day of doxycycline  treatment, taking it twice daily, with seven days remaining. He reports no side effects from the antibiotic.  He has been using warm saline soaks on the affected area twice daily. The infection is less painful, less inflamed, and there is no yellow discharge. No fever, chills, or significant sweating.  A culture taken previously identified Staphylococcus aureus. He reports no formation of an abscess and described former  discharge as minimal, watery, and slightly bloody. But that has cleared up   Review of Systems See hpi      Objective:   Physical Exam  General Mental Status- Alert. General Appearance- Not in acute distress.        left knee pretibial area- less Inflammed area below the knee area about 2.0 cm wide x 2.0 cm less swollen/flatter. No dc. Area now is dry. No fluctuance. No pain or induration. left knee- no swollen and full rom.  left calf- not swollen       Assessment & Plan:   Patient Instructions  Left lower extremity skin infection due to Staphylococcus aureus Improving significantly with doxycycline . No abscess, adverse effects, fever, or chills. No DC. - Continue doxycycline  100 mg BID for 7 more days. - Warm saline soaks BID. - Monitor infection, report progress via MyChart or photo in 7 days or sooner if were to worsen as described though doubt. - Consider extending antibiotics if residual signs/symptoms after 10 days.   Shalay Carder, PA-C

## 2023-11-09 NOTE — Patient Instructions (Addendum)
 Left lower extremity skin infection due to Staphylococcus aureus Improving significantly with doxycycline . No abscess, adverse effects, fever, or chills. No DC. - Continue doxycycline  100 mg BID for 7 more days. - Warm saline soaks BID. - Monitor infection, report progress via MyChart or photo in 7 days or sooner if were to worsen as described though doubt. - Consider extending antibiotics if residual signs/symptoms after 10 days.

## 2023-11-15 ENCOUNTER — Ambulatory Visit: Admitting: Nurse Practitioner

## 2023-11-20 ENCOUNTER — Encounter: Payer: Self-pay | Admitting: Nurse Practitioner

## 2023-11-20 ENCOUNTER — Ambulatory Visit (INDEPENDENT_AMBULATORY_CARE_PROVIDER_SITE_OTHER): Admitting: Nurse Practitioner

## 2023-11-20 VITALS — BP 136/76 | HR 73 | Temp 98.0°F | Ht 70.0 in | Wt 223.0 lb

## 2023-11-20 DIAGNOSIS — R632 Polyphagia: Secondary | ICD-10-CM | POA: Diagnosis not present

## 2023-11-20 DIAGNOSIS — E66811 Obesity, class 1: Secondary | ICD-10-CM

## 2023-11-20 DIAGNOSIS — Z6832 Body mass index (BMI) 32.0-32.9, adult: Secondary | ICD-10-CM

## 2023-11-20 MED ORDER — PHENTERMINE-TOPIRAMATE ER 7.5-46 MG PO CP24: ORAL_CAPSULE | ORAL | 0 refills | Status: DC

## 2023-11-20 NOTE — Progress Notes (Signed)
 Office: [phone]  /  Fax: [phone]  WEIGHT SUMMARY AND BIOMETRICS  Weight Lost Since Last Visit: 1lb  Weight Gained Since Last Visit: 0lb   Vitals Temp: 98 F (36.7 C) BP: 136/76 Pulse Rate: 73 SpO2: 96 %   Anthropometric Measurements Height: 5' 10 (1.778 m) Weight: 223 lb (101.2 kg) BMI (Calculated): 32 Weight at Last Visit: 224lb Weight Lost Since Last Visit: 1lb Weight Gained Since Last Visit: 0lb Starting Weight: 236lb Total Weight Loss (lbs): 13 lb (5.897 kg)   Body Composition  Body Fat %: 25.9 % Fat Mass (lbs): 58 lbs Muscle Mass (lbs): 157.6 lbs Total Body Water (lbs): 108.8 lbs Visceral Fat Rating : 12   Other Clinical Data Fasting: No Labs: No Today's Visit #: 18 Starting Date: 04/25/22     HPI  Chief Complaint: OBESITY  Sabatino is here to discuss his progress with his obesity treatment plan. He is on the the Category 4 Plan and states he is following his eating plan approximately 50 % of the time. He states he is exercising 30 minutes 2 days per week.   Interval History:  Since last office visit he has lost 1 pound.  [place] his last visit.  He recently finished antibiotics and struggled with nausea while taking it. He notes he wants to lose weight but is not doing anything to  help him to lose weight.  He started riding his bike 2 weeks ago and is planning to continue riding 2 days per week.    Pharmacotherapy for weight loss: He is currently taking Qsymia  dose 2,  1-2 days per week. Denies side effects.     Unable to take GLP 1s due to cost    Previous pharmacotherapy for medical weight loss:  Contrave  -didn't feel good while taking it.  Felt bad-depression.      Bariatric surgery:  Patient has not had bariatric surgery.     PHYSICAL EXAM:  Blood pressure 136/76, pulse 73, temperature 98 F (36.7 C), height 5' 10 (1.778 m), weight 223 lb (101.2 kg), SpO2 96%. Body mass index is 32 kg/m.  General: He  is overweight, cooperative, alert, well developed, and in no acute distress. PSYCH: Has normal mood, affect and thought process.   Extremities: No edema.  Neurologic: No gross sensory or motor deficits. No tremors or fasciculations noted.    DIAGNOSTIC DATA REVIEWED:  BMET    Component Value Date/Time   NA 141 08/28/2023 0000   K 4.3 08/28/2023 0000   CL 103 08/28/2023 0000   CO2 21 08/28/2023 0000   GLUCOSE 83 08/28/2023 0000   GLUCOSE 78 05/21/2023 1337   BUN 14 08/28/2023 0000   CREATININE 1.15 08/28/2023 0000   CREATININE 1.27 12/12/2019 0832   CALCIUM 9.0 08/28/2023 0000   GFRNONAA 71.66 02/17/2010 0902   GFRAA 93 12/24/2006 0927   Lab Results  Component Value Date   HGBA1C 5.7 (H) 08/28/2023   HGBA1C 5.9 (H) 04/25/2022   Lab Results  Component Value Date   INSULIN  22.0 04/25/2022   Lab Results  Component Value Date   TSH 3.740 10/09/2023   CBC    Component Value Date/Time   WBC 5.5 05/21/2023 1337   RBC 5.02 05/21/2023 1337   HGB 14.9 05/21/2023 1337   HCT 45.2 05/21/2023 1337   PLT 143.0 (L) 05/21/2023 1337   MCV 90.0 05/21/2023 1337   MCH 29.5 12/12/2019 0832   MCHC 33.0 05/21/2023 1337  RDW 14.9 05/21/2023 1337   Iron Studies    Component Value Date/Time   IRON 64 09/24/2015 1518   TIBC 282 09/24/2015 1518   IRONPCTSAT 23 09/24/2015 1518   Lipid Panel     Component Value Date/Time   CHOL 174 05/21/2023 1337   TRIG 156.0 (H) 05/21/2023 1337   HDL 47.00 05/21/2023 1337   CHOLHDL 4 05/21/2023 1337   VLDL 31.2 05/21/2023 1337   LDLCALC 96 05/21/2023 1337   LDLCALC 121 (H) 12/12/2019 0832   LDLDIRECT 141.6 12/24/2006 0927   Hepatic Function Panel     Component Value Date/Time   PROT 6.8 08/28/2023 0000   ALBUMIN 4.4 08/28/2023 0000   AST 29 08/28/2023 0000   ALT 29 08/28/2023 0000   ALKPHOS 57 08/28/2023 0000   BILITOT 0.4 08/28/2023 0000   BILIDIR 0.0 08/08/2012 1043      Component Value Date/Time   TSH 3.740 10/09/2023 1021    Nutritional Lab Results  Component Value Date   VD25OH 26.1 (L) 08/28/2023   VD25OH 39.3 08/10/2022   VD25OH 22.3 (L) 04/25/2022     ASSESSMENT AND PLAN  TREATMENT PLAN FOR OBESITY:  Recommended Dietary Goals  Caspar is currently in the action stage of change. As such, his goal is to continue weight management plan. He has agreed to practicing portion control and making smarter food choices, such as increasing vegetables and decreasing simple carbohydrates.  Behavioral Intervention  We discussed the following Behavioral Modification Strategies today: increasing lean protein intake to established goals, decreasing simple carbohydrates , increasing vegetables, increasing fiber rich foods, increasing water intake , work on meal planning and preparation, reading food labels , keeping healthy foods at home, continue to work on maintaining a reduced calorie state, getting the recommended amount of protein, incorporating whole foods, making healthy choices, staying well hydrated and practicing mindfulness when eating., and increase protein intake, fibrous foods (25 grams per day for women, 30 grams for men) and water to improve satiety and decrease hunger signals. .  Additional resources provided today: NA  Recommended Physical Activity Goals  Pamela has been advised to work up to 150 minutes of moderate intensity aerobic activity a week and strengthening exercises 2-3 times per week for cardiovascular health, weight loss maintenance and preservation of muscle mass.   He has agreed to Think about enjoyable ways to increase daily physical activity and overcoming barriers to exercise, Increase physical activity in their day and reduce sedentary time (increase NEAT)., Start strengthening exercises with a goal of 2-3 sessions a week , Increase the intensity, frequency or duration of aerobic exercises  , Increase volume of physical activity to a goal of 240 minutes a week, and Combine aerobic and  strengthening exercises for efficiency and improved cardiometabolic health.   Pharmacotherapy We discussed various medication options to help [place] with his weight loss efforts and we both agreed to continue Qsymia  dose 2 and take it daily.  Side effects discussed.  ASSOCIATED CONDITIONS ADDRESSED TODAY  Action/Plan  Polyphagia -     Phentermine -Topiramate  ER; Take one po daily  Dispense: 30 capsule; Refill: 0  Obesity, Class I, BMI 30-34.9 -     Phentermine -Topiramate  ER; Take one po daily  Dispense: 30 capsule; Refill: 0      Labs reviewed in chart with patient from 10/09/23  Will recheck labs in December.    We had a long discussion today about his weight loss journey.  He has lost a total of 13 pounds since  initially starting the practice but has plateaued and has not lost weight in months.  I discussed the importance with him starting to track and he needs to start exercising.  He admits that he is several days per week and does nothing but sit at home and watch TV while the kids are at school.  He could use this time to meal plan and to exercise.   Return in about 4 weeks (around 12/18/2023).SABRA He was informed of the importance of frequent follow up visits to maximize his success with intensive lifestyle modifications for his multiple health conditions.   ATTESTASTION STATEMENTS:  Reviewed by clinician on day of visit: allergies, medications, problem list, medical history, surgical history, family history, social history, and previous encounter notes.     Corean SAUNDERS. Niquan Charnley FNP-C

## 2023-12-19 ENCOUNTER — Ambulatory Visit (INDEPENDENT_AMBULATORY_CARE_PROVIDER_SITE_OTHER): Admitting: Nurse Practitioner

## 2023-12-19 ENCOUNTER — Encounter: Payer: Self-pay | Admitting: Nurse Practitioner

## 2023-12-19 VITALS — BP 122/79 | HR 73 | Temp 98.6°F | Ht 70.0 in | Wt 224.0 lb

## 2023-12-19 DIAGNOSIS — E559 Vitamin D deficiency, unspecified: Secondary | ICD-10-CM

## 2023-12-19 DIAGNOSIS — E66811 Obesity, class 1: Secondary | ICD-10-CM

## 2023-12-19 DIAGNOSIS — Z6832 Body mass index (BMI) 32.0-32.9, adult: Secondary | ICD-10-CM

## 2023-12-19 DIAGNOSIS — R632 Polyphagia: Secondary | ICD-10-CM

## 2023-12-19 MED ORDER — VITAMIN D (ERGOCALCIFEROL) 1.25 MG (50000 UNIT) PO CAPS: 50000.0000 [IU] | ORAL_CAPSULE | ORAL | 0 refills | Status: AC

## 2023-12-19 MED ORDER — PHENTERMINE-TOPIRAMATE ER 7.5-46 MG PO CP24: ORAL_CAPSULE | ORAL | 0 refills | Status: AC

## 2023-12-19 NOTE — Progress Notes (Signed)
 Office: [phone]  /  Fax: [phone]  WEIGHT SUMMARY AND BIOMETRICS  Weight Lost Since Last Visit: 0lb  Weight Gained Since Last Visit: 1lb   Vitals Temp: 98.6 F (37 C) BP: 122/79 Pulse Rate: 73 SpO2: 99 %   Anthropometric Measurements Height: 5' 10 (1.778 m) Weight: 224 lb (101.6 kg) BMI (Calculated): 32.14 Weight at Last Visit: 223lb Weight Lost Since Last Visit: 0lb Weight Gained Since Last Visit: 1lb Starting Weight: 236lb Total Weight Loss (lbs): 12 lb (5.443 kg)   Body Composition  Body Fat %: 25.2 % Fat Mass (lbs): 56.6 lbs Muscle Mass (lbs): 159.8 lbs Total Body Water (lbs): 107.6 lbs Visceral Fat Rating : 12   Other Clinical Data Fasting: no Labs: no Today's Visit #: 19 Starting Date: 04/25/22     HPI  Chief Complaint: OBESITY  Duward is here to discuss his progress with his obesity treatment plan. He is on the the Category 4 Plan and states he is following his eating plan approximately 50 % of the time. He states he is exercising 30-60 minutes 2 days per week.   Interval History:  Since last office visit he has gained 1 pound.  He starts that he really wants to lose weight and always has plans to take Qsymia  and start exercising but doesn't.  He always puts it off and doesn't know why he is not making the changes he needs to make and wants to make.  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  He is drinking water, juice, G2 and diet coke.  He is riding his bike with his children, doing yard work and works an active job.    Pharmacotherapy for weight loss: He is currently taking Qsymia  dose 2,  1-2 days per week. Doesn't generally take it when he is working night shift.  Denies side effects.     Unable to take GLP 1s due to cost    Previous pharmacotherapy for medical weight loss:  Contrave  -didn't feel good while taking it.  Felt bad-depression.      Bariatric surgery:  Patient has not had bariatric surgery.   Vit D  deficiency  He is taking Vit D 50,000 IU weekly.  Denies side effects.  Denies nausea, vomiting or muscle weakness.    Lab Results  Component Value Date   VD25OH 26.1 (L) 08/28/2023   VD25OH 39.3 08/10/2022   VD25OH 22.3 (L) 04/25/2022      PHYSICAL EXAM:  Blood pressure 122/79, pulse 73, temperature 98.6 F (37 C), height 5' 10 (1.778 m), weight 224 lb (101.6 kg), SpO2 99%. Body mass index is 32.14 kg/m.  General: He is overweight, cooperative, alert, well developed, and in no acute distress. PSYCH: Has normal mood, affect and thought process.   Extremities: No edema.  Neurologic: No gross sensory or motor deficits. No tremors or fasciculations noted.    DIAGNOSTIC DATA REVIEWED:  BMET    Component Value Date/Time   NA 141 08/28/2023 0000   K 4.3 08/28/2023 0000   CL 103 08/28/2023 0000   CO2 21 08/28/2023 0000   GLUCOSE 83 08/28/2023 0000   GLUCOSE 78 05/21/2023 1337   BUN 14 08/28/2023 0000   CREATININE 1.15 08/28/2023 0000   CREATININE 1.27 12/12/2019 0832   CALCIUM 9.0 08/28/2023 0000   GFRNONAA 71.66 02/17/2010 0902   GFRAA 93 12/24/2006 0927   Lab Results  Component Value Date   HGBA1C 5.7 (H) 08/28/2023   HGBA1C 5.9 (H) 04/25/2022  Lab Results  Component Value Date   INSULIN  22.0 04/25/2022   Lab Results  Component Value Date   TSH 3.740 10/09/2023   CBC    Component Value Date/Time   WBC 5.5 05/21/2023 1337   RBC 5.02 05/21/2023 1337   HGB 14.9 05/21/2023 1337   HCT 45.2 05/21/2023 1337   PLT 143.0 (L) 05/21/2023 1337   MCV 90.0 05/21/2023 1337   MCH 29.5 12/12/2019 0832   MCHC 33.0 05/21/2023 1337   RDW 14.9 05/21/2023 1337   Iron Studies    Component Value Date/Time   IRON 64 09/24/2015 1518   TIBC 282 09/24/2015 1518   IRONPCTSAT 23 09/24/2015 1518   Lipid Panel     Component Value Date/Time   CHOL 174 05/21/2023 1337   TRIG 156.0 (H) 05/21/2023 1337   HDL 47.00 05/21/2023 1337   CHOLHDL 4 05/21/2023 1337   VLDL 31.2  05/21/2023 1337   LDLCALC 96 05/21/2023 1337   LDLCALC 121 (H) 12/12/2019 0832   LDLDIRECT 141.6 12/24/2006 0927   Hepatic Function Panel     Component Value Date/Time   PROT 6.8 08/28/2023 0000   ALBUMIN 4.4 08/28/2023 0000   AST 29 08/28/2023 0000   ALT 29 08/28/2023 0000   ALKPHOS 57 08/28/2023 0000   BILITOT 0.4 08/28/2023 0000   BILIDIR 0.0 08/08/2012 1043      Component Value Date/Time   TSH 3.740 10/09/2023 1021   Nutritional Lab Results  Component Value Date   VD25OH 26.1 (L) 08/28/2023   VD25OH 39.3 08/10/2022   VD25OH 22.3 (L) 04/25/2022     ASSESSMENT AND PLAN  TREATMENT PLAN FOR OBESITY:  Recommended Dietary Goals  Bradley James is currently in the action stage of change. As such, his goal is to continue weight management plan. He has agreed to the Category 4 Plan.  I would recommend tracking using mynetdiary and will review calories and macros at next visit.  We had a long discussion today about motivation and ways to help.  His wife is also currently working on weight loss.  I recommended that they start exercising together and eating together.  He tends to eat at his mother's house more.  Discussed meal prepping.    Behavioral Intervention  We discussed the following Behavioral Modification Strategies today: increasing lean protein intake to established goals, decreasing simple carbohydrates , increasing vegetables, increasing fiber rich foods, increasing water intake , work on meal planning and preparation, work on tracking and journaling calories using tracking application, reading food labels , keeping healthy foods at home, continue to work on maintaining a reduced calorie state, getting the recommended amount of protein, incorporating whole foods, making healthy choices, staying well hydrated and practicing mindfulness when eating., and increase protein intake, fibrous foods (25 grams per day for women, 30 grams for men) and water to improve satiety and decrease  hunger signals. .  Additional resources provided today: NA  Recommended Physical Activity Goals  Eidan has been advised to work up to 150 minutes of moderate intensity aerobic activity a week and strengthening exercises 2-3 times per week for cardiovascular health, weight loss maintenance and preservation of muscle mass.   He has agreed to Think about enjoyable ways to increase daily physical activity and overcoming barriers to exercise, Increase physical activity in their day and reduce sedentary time (increase NEAT)., Start strengthening exercises with a goal of 2-3 sessions a week , Continue to gradually increase the amount and intensity of exercise routine, Increase volume of  physical activity to a goal of 240 minutes a week, and Combine aerobic and strengthening exercises for efficiency and improved cardiometabolic health.   Pharmacotherapy We discussed various medication options to help [place] with his weight loss efforts and we both agreed to continue Qsymia  dose 2 for the next month.  To take daily.  Will consider stopping at next visit if not losing weight.    ASSOCIATED CONDITIONS ADDRESSED TODAY  Action/Plan  Vitamin D  deficiency -     Vitamin D  (Ergocalciferol ); Take 1 capsule (50,000 Units total) by mouth every 7 (seven) days.  Dispense: 5 capsule; Refill: 0  Polyphagia -     Phentermine -Topiramate  ER; Take one po daily  Dispense: 30 capsule; Refill: 0  Obesity, Class I, BMI 30-34.9 -     Phentermine -Topiramate  ER; Take one po daily  Dispense: 30 capsule; Refill: 0         Return in about 6 weeks (around 01/30/2024).SABRA He was informed of the importance of frequent follow up visits to maximize his success with intensive lifestyle modifications for his multiple health conditions.   ATTESTASTION STATEMENTS:  Reviewed by clinician on day of visit: allergies, medications, problem list, medical history, surgical history, family history, social history, and previous encounter  notes.     Corean SAUNDERS. Lorenza Winkleman FNP-C

## 2023-12-20 ENCOUNTER — Telehealth: Payer: Self-pay

## 2023-12-20 NOTE — Telephone Encounter (Signed)
 Received fax from [employer] that Qsymia  has been approved through 03/21/24.

## 2023-12-20 NOTE — Telephone Encounter (Signed)
 PA submitted for Qsymia  through [employer]. Awaiting insurance determination. Key: [id number]

## 2024-02-07 ENCOUNTER — Ambulatory Visit: Admitting: Nurse Practitioner

## 2024-05-21 ENCOUNTER — Encounter: Admitting: Internal Medicine
